# Patient Record
Sex: Male | Born: 1985
Health system: Southern US, Community
[De-identification: ages and names within clinical notes are randomized; demographics above are authoritative.]

## PROBLEM LIST (undated history)

## (undated) DIAGNOSIS — Z973 Presence of spectacles and contact lenses: Secondary | ICD-10-CM

## (undated) DIAGNOSIS — K219 Gastro-esophageal reflux disease without esophagitis: Secondary | ICD-10-CM

## (undated) DIAGNOSIS — R51 Headache: Secondary | ICD-10-CM

## (undated) DIAGNOSIS — Z87442 Personal history of urinary calculi: Secondary | ICD-10-CM

## (undated) DIAGNOSIS — R519 Headache, unspecified: Secondary | ICD-10-CM

## (undated) HISTORY — DX: Presence of spectacles and contact lenses: Z97.3

---

## 2011-08-09 ENCOUNTER — Ambulatory Visit: Payer: Self-pay

## 2011-08-09 DIAGNOSIS — M545 Low back pain, unspecified: Secondary | ICD-10-CM

## 2011-08-09 DIAGNOSIS — R300 Dysuria: Secondary | ICD-10-CM

## 2011-08-09 DIAGNOSIS — M542 Cervicalgia: Secondary | ICD-10-CM

## 2011-08-09 DIAGNOSIS — B9789 Other viral agents as the cause of diseases classified elsewhere: Secondary | ICD-10-CM

## 2013-07-25 ENCOUNTER — Ambulatory Visit (INDEPENDENT_AMBULATORY_CARE_PROVIDER_SITE_OTHER): Payer: 59 | Admitting: Family Medicine

## 2013-07-25 ENCOUNTER — Ambulatory Visit: Payer: 59

## 2013-07-25 VITALS — BP 122/74 | HR 86 | Temp 98.8°F | Resp 17 | Ht 64.5 in | Wt 162.2 lb

## 2013-07-25 DIAGNOSIS — R300 Dysuria: Secondary | ICD-10-CM

## 2013-07-25 DIAGNOSIS — R109 Unspecified abdominal pain: Secondary | ICD-10-CM

## 2013-07-25 DIAGNOSIS — R319 Hematuria, unspecified: Secondary | ICD-10-CM

## 2013-07-25 LAB — POCT CBC
Granulocyte percent: 56.4 %G (ref 37–80)
HCT, POC: 46.5 % (ref 43.5–53.7)
Hemoglobin: 15.1 g/dL (ref 14.1–18.1)
Lymph, poc: 2.3 (ref 0.6–3.4)
MCH, POC: 30.1 pg (ref 27–31.2)
MCHC: 32.5 g/dL (ref 31.8–35.4)
MCV: 92.6 fL (ref 80–97)
MID (cbc): 0.5 (ref 0–0.9)
MPV: 9.7 fL (ref 0–99.8)
POC Granulocyte: 3.7 (ref 2–6.9)
POC LYMPH PERCENT: 35.8 % (ref 10–50)
POC MID %: 7.8 %M (ref 0–12)
Platelet Count, POC: 275 10*3/uL (ref 142–424)
RBC: 5.02 M/uL (ref 4.69–6.13)
RDW, POC: 13.1 %
WBC: 6.5 10*3/uL (ref 4.6–10.2)

## 2013-07-25 LAB — POCT UA - MICROSCOPIC ONLY
Bacteria, U Microscopic: NEGATIVE
Casts, Ur, LPF, POC: NEGATIVE
RBC, urine, microscopic: NEGATIVE
WBC, Ur, HPF, POC: NEGATIVE

## 2013-07-25 LAB — POCT URINALYSIS DIPSTICK
Glucose, UA: NEGATIVE
Ketones, UA: NEGATIVE
Leukocytes, UA: NEGATIVE
Nitrite, UA: NEGATIVE
Protein, UA: NEGATIVE
Spec Grav, UA: 1.015
Urobilinogen, UA: 0.2

## 2013-07-25 LAB — COMPREHENSIVE METABOLIC PANEL
ALT: 24 U/L (ref 0–53)
AST: 19 U/L (ref 0–37)
BUN: 11 mg/dL (ref 6–23)
CO2: 29 mEq/L (ref 19–32)
Calcium: 9.8 mg/dL (ref 8.4–10.5)
Chloride: 100 mEq/L (ref 96–112)
Creat: 0.85 mg/dL (ref 0.50–1.35)
Glucose, Bld: 83 mg/dL (ref 70–99)
Potassium: 4 mEq/L (ref 3.5–5.3)
Sodium: 136 mEq/L (ref 135–145)
Total Bilirubin: 0.6 mg/dL (ref 0.3–1.2)

## 2013-07-25 LAB — COMPREHENSIVE METABOLIC PANEL WITH GFR
Albumin: 4.8 g/dL (ref 3.5–5.2)
Alkaline Phosphatase: 54 U/L (ref 39–117)
Total Protein: 7.4 g/dL (ref 6.0–8.3)

## 2013-07-25 IMAGING — CR DG ABDOMEN 1V
1 series · 1 of 1 positions shown · non-contrast
Comparison: None.

CLINICAL DATA: Left-sided flank pain.  Hematuria.

EXAM:
ABDOMEN - 1 VIEW

[AP]
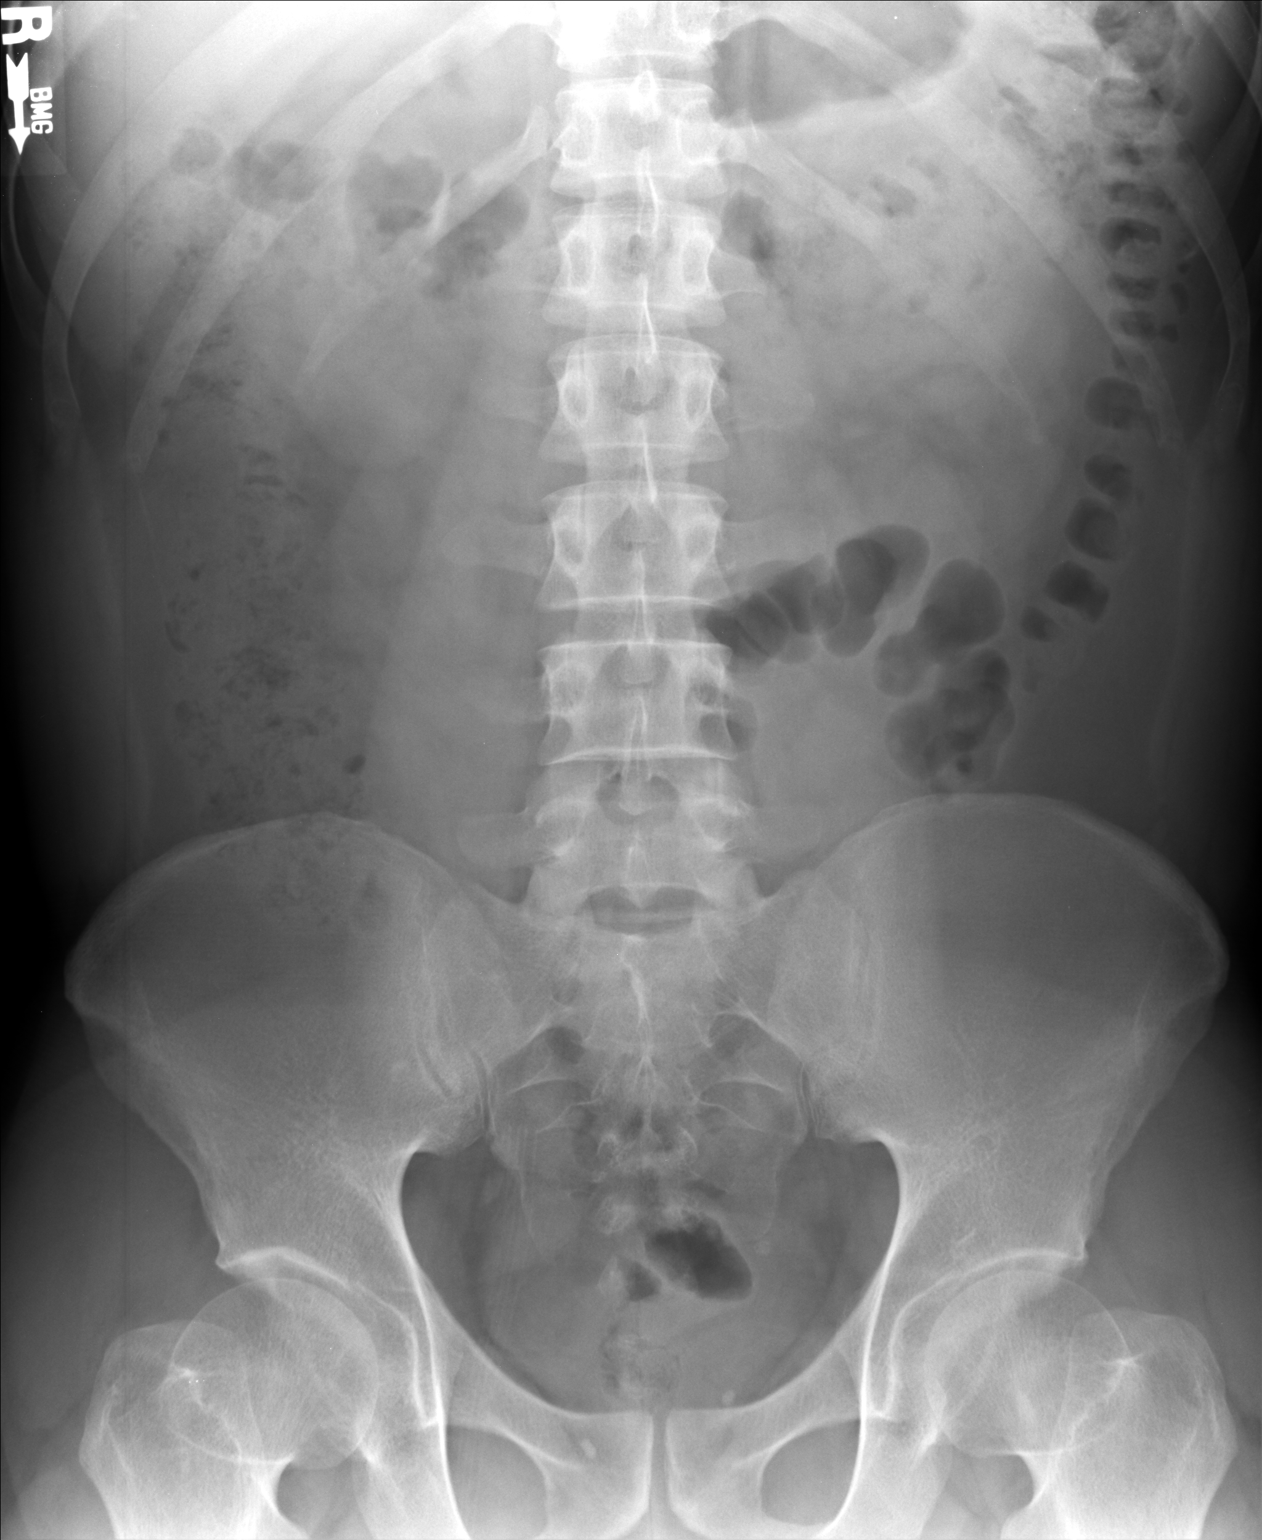

[1 of 1 positions shown; findings below may reference images not displayed]

FINDINGS: Nonspecific bowel gas pattern. No evidence of obstruction. No acute
osseous abnormality. No radiopaque density overlying the renal
shadows or expected position of the ureters. Multiple rounded
radiopaque densities are identified in the pelvis, the majority of
which definitely represent phleboliths. One the densities overlies
the bladder, and a bladder calculus cannot be completely excluded.
IMPRESSION: 1. No radiographic evidence of nephrolithiasis or ureteral calculus.

2. Multiple calcific densities in the pelvis, the majority of which
definitely represent phleboliths. One the densities overlies the
bladder, and a bladder calculus cannot be completely excluded.

## 2013-07-25 MED ORDER — TAMSULOSIN HCL 0.4 MG PO CAPS: 0.4000 mg | ORAL_CAPSULE | Freq: Every day | ORAL | Status: DC

## 2013-07-25 MED ORDER — KETOROLAC TROMETHAMINE 60 MG/2ML IM SOLN
60.0000 mg | Freq: Once | INTRAMUSCULAR | Status: AC
  Administered 2013-07-25: 60 mg via INTRAMUSCULAR

## 2013-07-25 NOTE — Patient Instructions (Signed)
Ureteral Colic Ureteral colic is spasm-like pain from the kidney or the ureter. This is often caused by a kidney stone. The pain is caused by the stone trying to get through the tubes that pass your pee. HOME CARE   Drink enough fluids to keep your pee (urine) clear or pale yellow.  Strain all your pee. A strainer will be provided. Keep anything caught in the strainer and bring it to your doctor. The stone causing the pain may be very small.  Only take medicine as told by your doctor.  Follow up with your doctor as told. GET HELP RIGHT AWAY IF:   Pain is not controlled with medicine.  Pain continues or gets worse.  The pain changes and there is chest or belly (abdominal) pain.  You pass out (faint).  You cannot pee.  You keep throwing up (vomiting).  You have a temperature by mouth above 102 F (38.9 C), not controlled by medicine. MAKE SURE YOU:   Understand these instructions.  Will watch this condition.  Will get help right away if you are not doing well or get worse. Document Released: 01/28/2008 Document Revised: 11/03/2011 Document Reviewed: 01/28/2008 [facility] Patient Information 2014 [place], [place]. Hematuria, Adult Hematuria (blood in your urine) can be caused by a bladder infection (cystitis), kidney infection (pyelonephritis), prostate infection (prostatitis), or kidney stone. Infections will usually respond to antibiotics (medications which kill germs), and a kidney stone will usually pass through your urine without further treatment. If you were put on antibiotics, take all the medicine until gone. You may feel better in a few days, but take all of your medicine or the infection may not respond and become more difficult to treat. If antibiotics were not given, an infection did not cause the blood in the urine. A further work up to find out the reason may be needed. HOME CARE INSTRUCTIONS   Drink lots of fluid, 3 to 4 quarts a day. If you have been diagnosed with  an infection, cranberry juice is especially recommended, in addition to large amounts of water.  Avoid caffeine, tea, and carbonated beverages, because they tend to irritate the bladder.  Avoid alcohol as it may irritate the prostate.  Only take over-the-counter or prescription medicines for pain, discomfort, or fever as directed by your caregiver.  If you have been diagnosed with a kidney stone follow your caregivers instructions regarding straining your urine to catch the stone. TO PREVENT FURTHER INFECTIONS:  Empty the bladder often. Avoid holding urine for long periods of time.  After a bowel movement, women should cleanse front to back. Use each tissue only once.  Empty the bladder before and after sexual intercourse if you are a male.  Return to your caregiver if you develop back pain, fever, nausea (feeling sick to your stomach), vomiting, or your symptoms (problems) are not better in 3 days. Return sooner if you are getting worse. If you have been requested to return for further testing make sure to keep your appointments. If an infection is not the cause of blood in your urine, X-rays may be required. Your caregiver will discuss this with you. SEEK IMMEDIATE MEDICAL CARE IF:   You have a persistent fever over 102 F (38.9 C).  You develop severe vomiting and are unable to keep the medication down.  You develop severe back or abdominal pain despite taking your medications.  You begin passing a large amount of blood or clots in your urine.  You feel extremely weak or  faint, or pass out. MAKE SURE YOU:   Understand these instructions.  Will watch your condition.  Will get help right away if you are not doing well or get worse. Document Released: 08/11/2005 Document Revised: 11/03/2011 Document Reviewed: 03/30/2008 [facility] Patient Information 2014 [place], [place].

## 2013-07-25 NOTE — Progress Notes (Signed)
Chief Complaint:  Chief Complaint  Patient presents with  . Dysuria  . Back Pain    HPI: Daniel Wang is a 27 y.o. male who is here with 3 day history of left sided flank pain and feels like it moves. He deneis fevers, chill. Worse with ROM especially bending forward, states he Has had hsitory of UTI several years ago 2011. During those 2 times, he  only had blood in his urine but not any bacteria. HE was given antibiotics and it cleared him of his symptoms. Denies fevers, chills, nausea, vomiting.Deneis any STDs. He also denies and rashes or penile discharge. He denies any scrotal or testicular pain. He has no burning with urination. He works at [employer] and drinks a lot of sweet ice tea.   History reviewed. No pertinent past medical history. History reviewed. No pertinent past surgical history. History   Social History  . Marital Status: Married    Spouse Name: N/A    Number of Children: N/A  . Years of Education: N/A   Social History Main Topics  . Smoking status: Never Smoker   . Smokeless tobacco: None  . Alcohol Use: No  . Drug Use: No  . Sexual Activity: No   Other Topics Concern  . None   Social History Narrative  . None   History reviewed. No pertinent family history. No Known Allergies Prior to Admission medications   Not on File     ROS: The patient denies fevers, chills, night sweats, unintentional weight loss, chest pain, palpitations, wheezing, dyspnea on exertion, nausea, vomiting, abdominal pain, dysuria, hematuria, melena, numbness, weakness, or tingling.   All other systems have been reviewed and were otherwise negative with the exception of those mentioned in the HPI and as above.    PHYSICAL EXAM: Filed Vitals:   07/25/13 1512  BP: 122/74  Pulse: 86  Temp: 98.8 F (37.1 C)  Resp: 17   Filed Vitals:   07/25/13 1512  Height: 5' 4.5" (1.638 m)  Weight: 162 lb 3.2 oz (73.573 kg)   Body mass index is 27.42 kg/(m^2).  General: Alert,  no acute distress HEENT:  Normocephalic, atraumatic, oropharynx patent. EOMI, PERRLA Cardiovascular:  Regular rate and rhythm, no rubs murmurs or gallops.  No Carotid bruits, radial pulse intact. No pedal edema.  Respiratory: Clear to auscultation bilaterally.  No wheezes, rales, or rhonchi.  No cyanosis, no use of accessory musculature GI: No organomegaly, abdomen is soft and non-tender, positive bowel sounds.  No masses. Skin: No rashes. Neurologic: Facial musculature symmetric. Psychiatric: Patient is appropriate throughout our interaction. Lymphatic: No cervical lymphadenopathy Musculoskeletal: Gait intact. Testicles nl Scrotum nl No inguinal hernias No CVA tenderness   LABS: Results for orders placed in visit on 07/25/13  POCT UA - MICROSCOPIC ONLY      Result Value Range   WBC, Ur, HPF, POC neg     RBC, urine, microscopic neg     Bacteria, U Microscopic neg     Mucus, UA neg     Epithelial cells, urine per micros neg     Crystals, Ur, HPF, POC neg     Casts, Ur, LPF, POC neg     Yeast, UA neg    POCT URINALYSIS DIPSTICK      Result Value Range   Color, UA yellow     Clarity, UA clear     Glucose, UA neg     Bilirubin, UA neg     Ketones, UA  neg     Spec Grav, UA 1.015     Blood, UA trace-lysed     pH, UA 7.0     Protein, UA neg     Urobilinogen, UA 0.2     Nitrite, UA neg     Leukocytes, UA Negative    POCT CBC      Result Value Range   WBC 6.5  4.6 - 10.2 K/uL   Lymph, poc 2.3  0.6 - 3.4   POC LYMPH PERCENT 35.8  10 - 50 %L   MID (cbc) 0.5  0 - 0.9   POC MID % 7.8  0 - 12 %M   POC Granulocyte 3.7  2 - 6.9   Granulocyte percent 56.4  37 - 80 %G   RBC 5.02  4.69 - 6.13 M/uL   Hemoglobin 15.1  14.1 - 18.1 g/dL   HCT, POC 16.1  09.6 - 53.7 %   MCV 92.6  80 - 97 fL   MCH, POC 30.1  27 - 31.2 pg   MCHC 32.5  31.8 - 35.4 g/dL   RDW, POC 04.5     Platelet Count, POC 275  142 - 424 K/uL   MPV 9.7  0 - 99.8 fL     EKG/XRAY:   Primary read interpreted by  Dr. Conley Rolls at [facility]. ? Renal stones vs phleboliths in bladder No obstruction, no free air    ASSESSMENT/PLAN: Encounter Diagnoses  Name Primary?  . Dysuria Yes  . Hematuria   . Flank pain    ? Renal stone vs cysitis Drinks a lot of  Sweet ice tea since works at [employer] He has had "UTI" in the past x 2 Will rx flomax ( short course) , push fluids, if no improvement in 48-72 hours then will rx cipro 500 mg BID x 1 week and refer to urology Toradal IM given CMP, GC pending F/u prn   Gross sideeffects, risk and benefits, and alternatives of medications d/w patient. Patient is aware that all medications have potential sideeffects and we are unable to predict every sideeffect or drug-drug interaction that may occur.  Hamilton Capri PHUONG, DO 07/25/2013 4:18 PM

## 2013-07-26 LAB — GC/CHLAMYDIA PROBE AMP
CT Probe RNA: NEGATIVE
GC Probe RNA: NEGATIVE

## 2013-07-27 ENCOUNTER — Telehealth: Payer: Self-pay | Admitting: Family Medicine

## 2013-07-27 DIAGNOSIS — R300 Dysuria: Secondary | ICD-10-CM

## 2013-07-27 MED ORDER — CIPROFLOXACIN HCL 500 MG PO TABS: 500.0000 mg | ORAL_TABLET | Freq: Two times a day (BID) | ORAL | Status: DC

## 2013-07-27 NOTE — Telephone Encounter (Signed)
No improvement, still has similar sxs. He is taking flomax. Told him about labs and xray results.

## 2013-08-03 ENCOUNTER — Ambulatory Visit (INDEPENDENT_AMBULATORY_CARE_PROVIDER_SITE_OTHER): Payer: 59 | Admitting: Family Medicine

## 2013-08-03 VITALS — BP 110/60 | HR 82 | Temp 98.3°F | Resp 16 | Ht 64.0 in | Wt 161.6 lb

## 2013-08-03 DIAGNOSIS — R319 Hematuria, unspecified: Secondary | ICD-10-CM

## 2013-08-03 DIAGNOSIS — R1032 Left lower quadrant pain: Secondary | ICD-10-CM

## 2013-08-03 LAB — POCT URINALYSIS DIPSTICK
Blood, UA: NEGATIVE
Glucose, UA: NEGATIVE
Leukocytes, UA: NEGATIVE
Nitrite, UA: NEGATIVE
Protein, UA: NEGATIVE
Urobilinogen, UA: 0.2

## 2013-08-03 LAB — POCT UA - MICROSCOPIC ONLY
Casts, Ur, LPF, POC: NEGATIVE
Crystals, Ur, HPF, POC: NEGATIVE
Mucus, UA: NEGATIVE

## 2013-08-03 MED ORDER — TAMSULOSIN HCL 0.4 MG PO CAPS: 0.4000 mg | ORAL_CAPSULE | Freq: Every day | ORAL | Status: DC

## 2013-08-03 NOTE — Progress Notes (Signed)
Subjective:    Patient ID: Daniel Wang, male    DOB: 01-06-1986, 27 y.o.   MRN: [id number] This chart was scribed for Norberto Sorenson, MD by Clydene Laming, ED Scribe. This patient was seen in room 1 and the patient's care was started at 5:47 PM. HPI HPI Comments: Daniel Wang is a 27 y.o. male who presents to the [facility] following up for a diagnosis of presumed left kidney stone by Dr. Conley Rolls last week. Pt had poss bladder calculus seen on KUB along with some trace hematuria when he presented w/ left flank pain. Uriprobe, urine clx, cbc, and cmp were normal.  Pt states he is overall much improved but he is still having some pain over the left lower flank which is radiating around to llq of abd.  Pt denies any pain in the bladder or in the penile or scrotal region. Pt also denies nausea, vomiting, rashes, or diarrhea. He had had a little bit of constipation.  He was prescribed flomax which he is still on and has completed the course of cipro. Four days ago pt experienced one episode of mild dysuria but did not see anything present in the urine.  Was not straining his urine at this void as he was at work but wonders if he passed something due to the penile pain during the void. Has not occurred before or since.  However, he is still having a little left flank and llq pain.  There are no active problems to display for this patient.  History reviewed. No pertinent past medical history. No past surgical history on file. No Known Allergies Prior to Admission medications   Medication Sig Start Date End Date Taking? Authorizing Provider  ciprofloxacin (CIPRO) 500 MG tablet Take 1 tablet (500 mg total) by mouth 2 (two) times daily. Return to office to recheck urine 48 hours after finished antibiotics. 07/27/13   Thao P Le, DO  tamsulosin (FLOMAX) 0.4 MG CAPS capsule Take 1 capsule (0.4 mg total) by mouth daily. 07/25/13   Thao Dayna Ramus, DO   History   Social History  . Marital Status: Married     Spouse Name: N/A    Number of Children: N/A  . Years of Education: N/A   Occupational History  . Not on file.   Social History Main Topics  . Smoking status: Never Smoker   . Smokeless tobacco: Not on file  . Alcohol Use: No  . Drug Use: No  . Sexual Activity: No   Other Topics Concern  . Not on file   Social History Narrative  . No narrative on file      Review of Systems  Constitutional: Negative for fever, chills, diaphoresis, activity change and appetite change.  Respiratory: Negative for shortness of breath.   Cardiovascular: Negative for chest pain.  Gastrointestinal: Positive for abdominal pain and constipation. Negative for nausea, vomiting, diarrhea, blood in stool, abdominal distention, anal bleeding and rectal pain.  Genitourinary: Positive for flank pain. Negative for dysuria, frequency, hematuria, decreased urine volume, discharge, difficulty urinating and penile pain.  Hematological: Negative for adenopathy.      BP 110/60  Pulse 82  Temp(Src) 98.3 F (36.8 C) (Oral)  Resp 16  Ht 5\' 4"  (1.626 m)  Wt 161 lb 9.6 oz (73.301 kg)  BMI 27.72 kg/m2  SpO2 99% Objective:   Physical Exam  Constitutional: He appears well-developed and well-nourished. No distress.  HENT:  Head: Normocephalic and atraumatic.  Neck: Normal  range of motion. Neck supple. No thyromegaly present.  Cardiovascular: Normal rate, regular rhythm and normal heart sounds.   Pulmonary/Chest: Effort normal and breath sounds normal.  Abdominal: Soft. Normal appearance and bowel sounds are normal. He exhibits no distension and no mass. There is no hepatosplenomegaly. There is tenderness ( right and left lower quadrant (mild)) in the left lower quadrant. There is no rigidity, no rebound, no guarding and no CVA tenderness. No hernia.  Lymphadenopathy:    He has no cervical adenopathy.  Skin: He is not diaphoretic.      Results for orders placed in visit on 08/03/13  POCT UA - MICROSCOPIC  ONLY      Result Value Range   WBC, Ur, HPF, POC 0-2     RBC, urine, microscopic 0-1     Bacteria, U Microscopic trace     Mucus, UA neg     Epithelial cells, urine per micros 0-2     Crystals, Ur, HPF, POC neg     Casts, Ur, LPF, POC neg     Yeast, UA neg    POCT URINALYSIS DIPSTICK      Result Value Range   Color, UA light yellow     Clarity, UA clear     Glucose, UA neg     Bilirubin, UA neg     Ketones, UA neg     Spec Grav, UA 1.010     Blood, UA neg     pH, UA 7.0     Protein, UA neg     Urobilinogen, UA 0.2     Nitrite, UA neg     Leukocytes, UA Negative     Assessment & Plan:   LLQ abdominal pain - Plan: POCT UA - Microscopic Only, POCT urinalysis dipstick. Sounds like pt has passed stone, but rec cont flomax for now as long as not having any side effects since still having pain. Perhaps pain could be due to constipation so augment w/ miralax or colace if needed.  Rec watchful waiting for the next 1-2 wks. If pain continues, rec repeat eval in clinic w/ repeat labs and may need to consider CT scan.  Hematuria - Plan: POCT UA - Microscopic Only, POCT urinalysis dipstick - resolved.  Meds ordered this encounter  Medications  . tamsulosin (FLOMAX) 0.4 MG CAPS capsule    Sig: Take 1 capsule (0.4 mg total) by mouth daily.    Dispense:  30 capsule    Refill:  1    I personally performed the services described in this documentation, which was scribed in my presence. The recorded information has been reviewed and considered, and addended by me as needed.  Norberto Sorenson, MD MPH

## 2013-08-03 NOTE — Patient Instructions (Signed)
If your pain continues for another 2 weeks or gets WORSE or you notice any other changes, please come back and we will consider repeating your blood labs, rectal exam, repeating your xray and discuss whether you need a CT scan.  However, with the urine looking great today, I hope that if you continue on your flomax and continue drinking LOTS of water that your pain will resolve.  Make sure you are not having ANY constipation or gas pain - consider adding in a fiber supplement.  Ureteral Colic (Kidney Stones) Ureteral colic is the result of a condition when kidney stones form inside the kidney. Once kidney stones are formed they may move into the tube that connects the kidney with the bladder (ureter). If this occurs, this condition may cause pain (colic) in the ureter.  CAUSES  Pain is caused by stone movement in the ureter and the obstruction caused by the stone. SYMPTOMS  The pain comes and goes as the ureter contracts around the stone. The pain is usually intense, sharp, and stabbing in character. The location of the pain may move as the stone moves through the ureter. When the stone is near the kidney the pain is usually located in the back and radiates to the belly (abdomen). When the stone is ready to pass into the bladder the pain is often located in the lower abdomen on the side the stone is located. At this location, the symptoms may mimic those of a urinary tract infection with urinary frequency. Once the stone is located here it often passes into the bladder and the pain disappears completely. TREATMENT   Your caregiver will provide you with medicine for pain relief.  You may require specialized follow-up X-rays.  The absence of pain does not always mean that the stone has passed. It may have just stopped moving. If the urine remains completely obstructed, it can cause loss of kidney function or even complete destruction of the involved kidney. It is your responsibility and in your interest  that X-rays and follow-ups as suggested by your caregiver are completed. Relief of pain without passage of the stone can be associated with severe damage to the kidney, including loss of kidney function on that side.  If your stone does not pass on its own, additional measures may be taken by your caregiver to ensure its removal. HOME CARE INSTRUCTIONS   Increase your fluid intake. Water is the preferred fluid since juices containing vitamin C may acidify the urine making it less likely for certain stones (uric acid stones) to pass.  Strain all urine. A strainer will be provided. Keep all particulate matter or stones for your caregiver to inspect.  Take your pain medicine as directed.  Make a follow-up appointment with your caregiver as directed.  Remember that the goal is passage of your stone. The absence of pain does not mean the stone is gone. Follow your caregiver's instructions.  Only take over-the-counter or prescription medicines for pain, discomfort, or fever as directed by your caregiver. SEEK MEDICAL CARE IF:   Pain cannot be controlled with the prescribed medicine.  You have a fever.  Pain continues for longer than your caregiver advises it should.  There is a change in the pain, and you develop chest discomfort or constant abdominal pain.  You feel faint or pass out. MAKE SURE YOU:   Understand these instructions.  Will watch your condition.  Will get help right away if you are not doing well or get worse.  Document Released: 05/21/2005 Document Revised: 12/06/2012 Document Reviewed: 02/05/2011 [facility] Patient Information 2014 [place], [place].

## 2014-01-25 ENCOUNTER — Ambulatory Visit (INDEPENDENT_AMBULATORY_CARE_PROVIDER_SITE_OTHER): Payer: 59 | Admitting: Podiatry

## 2014-01-25 ENCOUNTER — Ambulatory Visit: Payer: 59 | Admitting: Podiatry

## 2014-01-25 ENCOUNTER — Encounter: Payer: Self-pay | Admitting: Podiatry

## 2014-01-25 VITALS — BP 143/99 | HR 76 | Resp 12

## 2014-01-25 DIAGNOSIS — L600 Ingrowing nail: Secondary | ICD-10-CM

## 2014-01-25 NOTE — Progress Notes (Signed)
Subjective:     Patient ID: Daniel Wang, male   DOB: 1986-06-17, 28 y.o.   MRN: [id number]  HPI patient points to left big toe stating that the corner has been sore and that he is tried to trim it and soak it without relief of symptoms for the last few months   Review of Systems  All other systems reviewed and are negative.      Objective:   Physical Exam  Nursing note and vitals reviewed. Constitutional: He is oriented to person, place, and time.  Cardiovascular: Intact distal pulses.   Musculoskeletal: Normal range of motion.  Neurological: He is oriented to person, place, and time.  Skin: Skin is warm.   neurovascular status intact with muscle strength adequate range of motion within normal limits. Patient's digits are well perfused and he is found to have incurvated left hallux medial border it's painful when pressed with no current signs of drainage     Assessment:     Ingrown toenail deformity with pain left hallux medial border    Plan:     H&P performed condition discussed. Recommended correction and explained possibilities for problems with his. Patient wants procedure and today I infiltrated 60 mg Xylocaine Marcaine mixture remove the medial border exposed matrix and applied chemical phenol 3 applications 30 seconds followed by alcohol lavaged and sterile dressing

## 2014-01-25 NOTE — Patient Instructions (Signed)

## 2014-01-25 NOTE — Progress Notes (Signed)
   Subjective:    Patient ID: Daniel Wang, male    DOB: 1986-04-23, 28 y.o.   MRN: [id number]  HPI PT STATED LT FOOT GREAT TOENAIL IS A LITTLE SORE. THE TOENAIL IS MUCH BETTER BUT SOMETIMES IT GET WORSE WHEN PUTTING PRESSURE ON IT. TRIED TRIM, PUT ALCOHOL, AND PEROXIDE AND HAVE TEMPORARY RELIEF.    Review of Systems  All other systems reviewed and are negative.      Objective:   Physical Exam        Assessment & Plan:

## 2014-05-29 ENCOUNTER — Ambulatory Visit (INDEPENDENT_AMBULATORY_CARE_PROVIDER_SITE_OTHER): Payer: Self-pay | Admitting: Podiatry

## 2014-05-29 ENCOUNTER — Encounter: Payer: Self-pay | Admitting: Podiatry

## 2014-05-29 VITALS — BP 115/68 | HR 74 | Resp 17

## 2014-05-29 DIAGNOSIS — L600 Ingrowing nail: Secondary | ICD-10-CM

## 2014-05-29 NOTE — Progress Notes (Signed)
   Subjective:    Patient ID: Daniel SladeJuan Wang, male    DOB: May 03, 1986, 28 y.o.   MRN: [id number]  HPI Painful left great ingrown nail,    Review of Systems     Objective:   Physical Exam        Assessment & Plan:

## 2014-05-29 NOTE — Patient Instructions (Signed)

## 2014-05-30 NOTE — Progress Notes (Signed)
Subjective:     Patient ID: Daniel Wang, male   DOB: 1986/04/28, 28 y.o.   MRN: [id number]  HPI patient points to left big toe stating his ingrown has really started to bother me again for about 6 months ago   Review of Systems     Objective:   Physical Exam Neurovascular status intact with incurvated left hallux medial border that's painful when pressed    Assessment:     Probable reoccurrence of ingrown toenail left hallux    Plan:     Anesthetized 60 mg Xylocaine Marcaine mixture and under sterile conditions remove the medial border and found a plug of nail tissue which I cleaned out completely and applied phenol 3 applications 30 seconds followed by alcohol lavaged and sterile dressing

## 2015-11-16 ENCOUNTER — Ambulatory Visit (INDEPENDENT_AMBULATORY_CARE_PROVIDER_SITE_OTHER): Payer: Self-pay | Admitting: Physician Assistant

## 2015-11-16 VITALS — BP 118/74 | HR 72 | Temp 99.3°F | Resp 14 | Ht 64.5 in | Wt 156.0 lb

## 2015-11-16 DIAGNOSIS — J0110 Acute frontal sinusitis, unspecified: Secondary | ICD-10-CM

## 2015-11-16 MED ORDER — GUAIFENESIN ER 1200 MG PO TB12: 1.0000 | ORAL_TABLET | Freq: Two times a day (BID) | ORAL | Status: AC

## 2015-11-16 MED ORDER — AMOXICILLIN-POT CLAVULANATE 875-125 MG PO TABS: 1.0000 | ORAL_TABLET | Freq: Two times a day (BID) | ORAL | Status: DC

## 2015-11-16 MED ORDER — FLUTICASONE PROPIONATE 50 MCG/ACT NA SUSP: 2.0000 | Freq: Every day | NASAL | Status: DC

## 2015-11-16 NOTE — Progress Notes (Signed)
   Daniel SladeJuan Wang  MRN: [id number] DOB: [birth year]  Subjective:  Pt presents to clinic with 3 week h/o frontal sinus pressure.  He really does not have headaches but just a pressure feeling that worsens when he bends over and he has developed some dizziness.  He has no h/o sinus problems and does not have known seasonal allergies.  He did have a wisdom tooth pulled in Oct 2016 and the sinus was affected and the oral surgeon told him to stop blowing his nose as much to decrease the pressure in her left maxillary sinus.  Home treatment - cold preps with sudafed  There are no active problems to display for this patient.   No current outpatient prescriptions on file prior to visit.   No current facility-administered medications on file prior to visit.    No Known Allergies  Review of Systems  HENT: Positive for congestion and rhinorrhea (green).   Respiratory: Negative for cough.    Objective:  BP 118/74 mmHg  Pulse 72  Temp(Src) 99.3 F (37.4 C) (Oral)  Resp 14  Ht 5' 4.5" (1.638 m)  Wt 156 lb (70.761 kg)  BMI 26.37 kg/m2  SpO2 97%  Physical Exam  Constitutional: He is oriented to person, place, and time and well-developed, well-nourished, and in no distress.  HENT:  Head: Normocephalic and atraumatic.  Right Ear: Hearing, tympanic membrane, external ear and ear canal normal.  Left Ear: Hearing, tympanic membrane, external ear and ear canal normal.  Nose: Right sinus exhibits maxillary sinus tenderness (mild) and frontal sinus tenderness (the worst). Left sinus exhibits frontal sinus tenderness.  Mouth/Throat: Uvula is midline, oropharynx is clear and moist and mucous membranes are normal.  Eyes: Conjunctivae are normal.  Neck: Normal range of motion.  Cardiovascular: Normal rate, regular rhythm and normal heart sounds.   Pulmonary/Chest: Effort normal and breath sounds normal. He has no wheezes.  Lymphadenopathy:       Head (right side): No tonsillar adenopathy present.       Head (left side): No tonsillar adenopathy present.    He has no cervical adenopathy.       Right: No supraclavicular adenopathy present.       Left: No supraclavicular adenopathy present.  Neurological: He is alert and oriented to person, place, and time. Gait normal.  Skin: Skin is warm and dry.  Psychiatric: Mood, memory, affect and judgment normal.    Assessment and Plan :  Acute frontal sinusitis, recurrence not specified - Plan: amoxicillin-clavulanate (AUGMENTIN) 875-125 MG tablet, Guaifenesin (MUCINEX MAXIMUM STRENGTH) 1200 MG TB12, fluticasone (FLONASE) 50 MCG/ACT nasal spray   Stop OTC medications that he has been using - push fluids and use the above treatment  Benny LennertSarah Amogh Komatsu PA-C  [facility] 11/16/2015 5:35 PM

## 2015-11-16 NOTE — Patient Instructions (Addendum)
     IF you received an x-ray today, you will receive an invoice from [facility]. Please contact [facility] at [phone] with questions or concerns regarding your invoice.   IF you received labwork today, you will receive an invoice from [facility]. Please contact [facility] at [phone] with questions or concerns regarding your invoice.   Our billing staff will not be able to assist you with questions regarding bills from these companies.  You will be contacted with the lab results as soon as they are available. The fastest way to get your results is to activate your My Chart account. Instructions are located on the last page of this paperwork. If you have not heard from us regarding the results in 2 weeks, please contact this office.      

## 2016-04-07 ENCOUNTER — Encounter (HOSPITAL_COMMUNITY): Payer: Self-pay | Admitting: *Deleted

## 2016-04-07 ENCOUNTER — Emergency Department (HOSPITAL_COMMUNITY): Payer: Self-pay

## 2016-04-07 ENCOUNTER — Emergency Department (HOSPITAL_COMMUNITY)
Admission: EM | Admit: 2016-04-07 | Discharge: 2016-04-08 | Disposition: A | Discharge status: Home / Self Care | Payer: Self-pay | Attending: Emergency Medicine | Admitting: Emergency Medicine

## 2016-04-07 DIAGNOSIS — R079 Chest pain, unspecified: Secondary | ICD-10-CM | POA: Insufficient documentation

## 2016-04-07 LAB — BASIC METABOLIC PANEL
Anion gap: 7 (ref 5–15)
BUN: 15 mg/dL (ref 6–20)
CALCIUM: 10 mg/dL (ref 8.9–10.3)
CO2: 28 mmol/L (ref 22–32)
CREATININE: 0.93 mg/dL (ref 0.61–1.24)
Chloride: 103 mmol/L (ref 101–111)
Glucose, Bld: 103 mg/dL — ABNORMAL HIGH (ref 65–99)
Potassium: 3.7 mmol/L (ref 3.5–5.1)
SODIUM: 138 mmol/L (ref 135–145)

## 2016-04-07 LAB — I-STAT TROPONIN, ED: TROPONIN I, POC: 0 ng/mL (ref 0.00–0.08)

## 2016-04-07 LAB — CBC
HCT: 45.2 % (ref 39.0–52.0)
Hemoglobin: 15.1 g/dL (ref 13.0–17.0)
MCH: 29.3 pg (ref 26.0–34.0)
MCHC: 33.4 g/dL (ref 30.0–36.0)
MCV: 87.8 fL (ref 78.0–100.0)
PLATELETS: 296 10*3/uL (ref 150–400)
RBC: 5.15 MIL/uL (ref 4.22–5.81)
RDW: 12.7 % (ref 11.5–15.5)
WBC: 9.7 10*3/uL (ref 4.0–10.5)

## 2016-04-07 IMAGING — DX DG CHEST 2V
2 series · 2 of 2 positions shown · non-contrast
Comparison: None.

CLINICAL DATA: Acute onset of sharp right-sided chest pain and mid
chest pressure. Congestion. Initial encounter.

EXAM:
CHEST  2 VIEW

[chest pa]
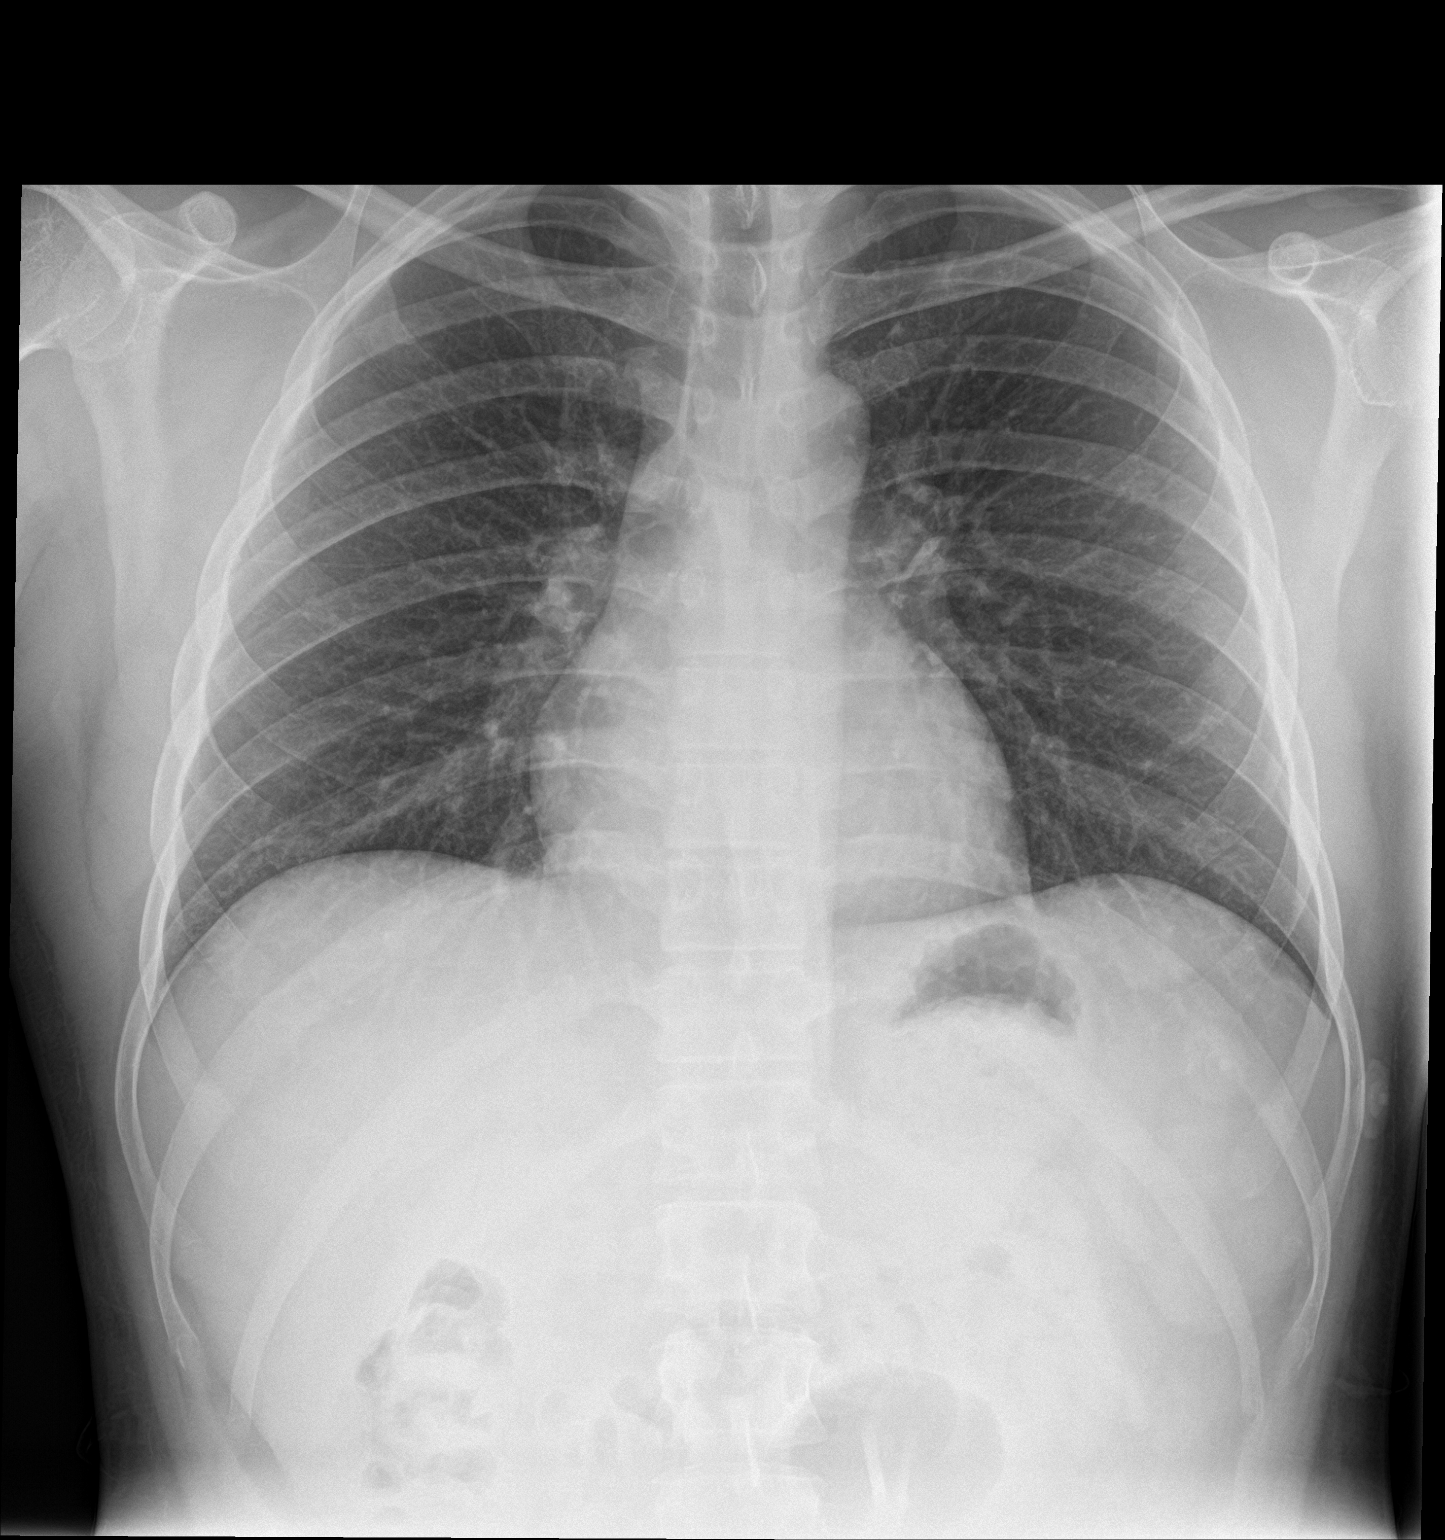

[chest lat]
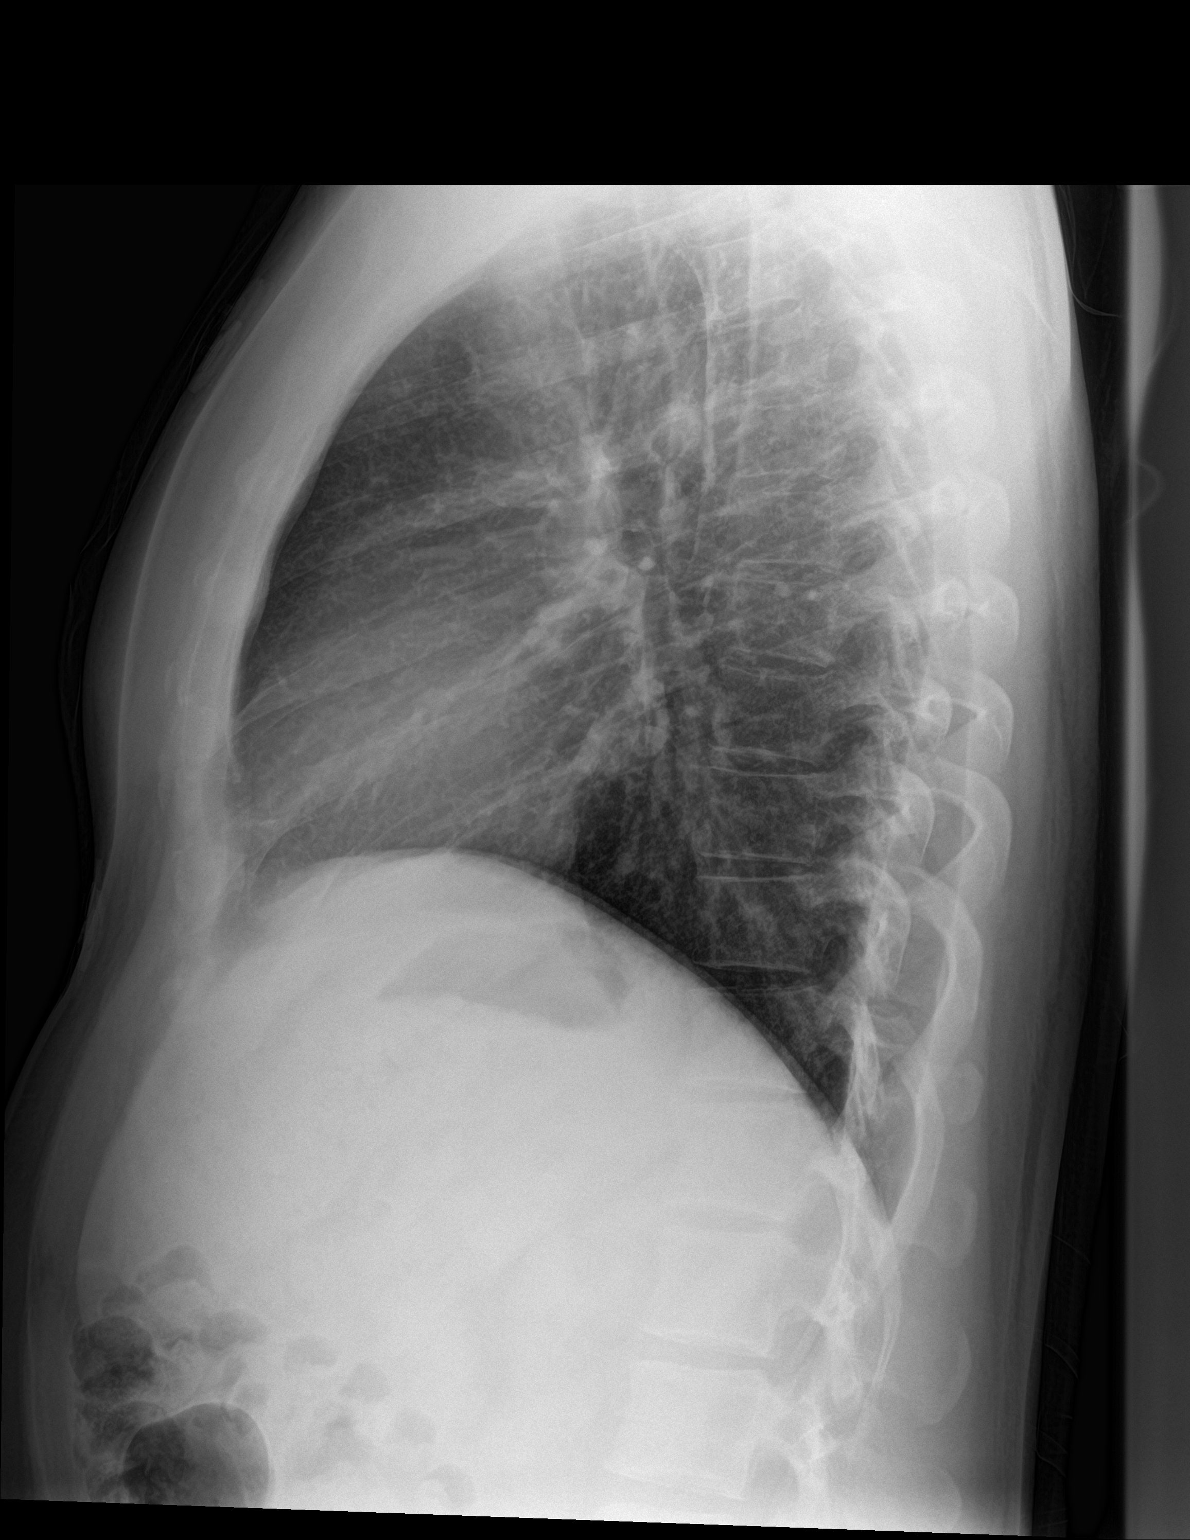

[2 of 2 positions shown; findings below may reference images not displayed]

FINDINGS: The lungs are well-aerated and clear. There is no evidence of focal
opacification, pleural effusion or pneumothorax.

The heart is normal in size; the mediastinal contour is within
normal limits. No acute osseous abnormalities are seen.
IMPRESSION: No acute cardiopulmonary process seen.

## 2016-04-07 MED ORDER — FAMOTIDINE 20 MG PO TABS: 20.0000 mg | ORAL_TABLET | Freq: Two times a day (BID) | ORAL | 0 refills | Status: DC

## 2016-04-07 NOTE — Discharge Instructions (Signed)
Please read and follow all provided instructions.  Your diagnoses today include:  1. Nonspecific chest pain    Tests performed today include:  An EKG of your heart  A chest x-ray  Cardiac enzymes - a blood test for heart muscle damage  Blood counts and electrolytes  Vital signs. See below for your results today.   Medications prescribed:   Pepcid (famotidine) - antihistamine  You can find this medication over-the-counter.   DO NOT exceed:   20mg  Pepcid every 12 hours  Take any prescribed medications only as directed.  Follow-up instructions: Please follow-up with your primary care provider as needed if symptoms persist.   Return instructions:  SEEK IMMEDIATE MEDICAL ATTENTION IF:  You have severe chest pain, especially if the pain is crushing or pressure-like and spreads to the arms, back, neck, or jaw, or if you have sweating, nausea (feeling sick to your stomach), or shortness of breath. THIS IS AN EMERGENCY. Don't wait to see if the pain will go away. Get medical help at once. Call 911 or 0 (operator). DO NOT drive yourself to the hospital.   Your chest pain gets worse and does not go away with rest.   You have an attack of chest pain lasting longer than usual, despite rest and treatment with the medications your caregiver has prescribed.   You wake from sleep with chest pain or shortness of breath.  You feel dizzy or faint.  You have chest pain not typical of your usual pain for which you originally saw your caregiver.   You have any other emergent concerns regarding your health.  Additional Information: Chest pain comes from many different causes. Your caregiver has diagnosed you as having chest pain that is not specific for one problem, but does not require admission.  You are at low risk for an acute heart condition or other serious illness.   Your vital signs today were: BP 132/98    Pulse 69    Temp 98.3 F (36.8 C) (Oral)    Resp 13    Ht 5\' 2"  (1.575  m)    Wt 72.6 kg    SpO2 99%    BMI 29.26 kg/m  If your blood pressure (BP) was elevated above 135/85 this visit, please have this repeated by your doctor within one month. --------------

## 2016-04-07 NOTE — ED Provider Notes (Signed)
[facility]-EMER[facility]ENCY DEPT Provider Note   CSN: [id number] Arrival date & time: 04/07/16  1928     History   Chief Complaint Chief Complaint  Patient presents with  . Chest Pain    HPI Daniel Wang is a 30 y.o. male.  Patient presents with four-day history of chest pain. Pain started 4 days ago on the right side of the chest. It went away and later that they came up on the left side of the chest. The next day moved to the middle of the chest. Pain comes and goes. It does not radiate. It is not exertional. It is not associated with shortness of breath, diaphoresis, nausea or vomiting. Symptoms have been minor today. Last episode of more substantial pain was last night. Patient does work in [employer] and does a lot of repetitive lifting. Patient also has chronic headaches for which he takes NSAIDs and Tylenol on a daily basis. No alcohol. No hypertension, high cholesterol, diabetes, smoking history, family history of coronary artery disease in first degree relatives.       History reviewed. No pertinent past medical history.  There are no active problems to display for this patient.   History reviewed. No pertinent surgical history.     Home Medications    Prior to Admission medications   Medication Sig Start Date End Date Taking? Authorizing Provider  amoxicillin-clavulanate (AUGMENTIN) 875-125 MG tablet Take 1 tablet by mouth 2 (two) times daily. 11/16/15   Morrell RiddleSarah L Weber, PA-C  famotidine (PEPCID) 20 MG tablet Take 1 tablet (20 mg total) by mouth 2 (two) times daily. 04/07/16   Renne CriglerJoshua Ladye Macnaughton, PA-C  fluticasone (FLONASE) 50 MCG/ACT nasal spray Place 2 sprays into both nostrils daily. 11/16/15   Morrell RiddleSarah L Weber, PA-C    Family History History reviewed. No pertinent family history.  Social History Social History  Substance Use Topics  . Smoking status: Never Smoker  . Smokeless tobacco: Never Used  . Alcohol use Yes     Allergies   Review of patient's allergies indicates  no known allergies.   Review of Systems Review of Systems  Constitutional: Negative for diaphoresis and fever.  Eyes: Negative for redness.  Respiratory: Negative for cough and shortness of breath.   Cardiovascular: Positive for chest pain. Negative for palpitations and leg swelling.  Gastrointestinal: Negative for abdominal pain, nausea and vomiting.  Genitourinary: Negative for dysuria.  Musculoskeletal: Negative for back pain and neck pain.  Skin: Negative for rash.  Neurological: Negative for syncope and light-headedness.  Psychiatric/Behavioral: The patient is not nervous/anxious.      Physical Exam Updated Vital Signs BP 132/98   Pulse 69   Temp 98.3 F (36.8 C) (Oral)   Resp 13   Ht 5\' 2"  (1.575 m)   Wt 72.6 kg   SpO2 99%   BMI 29.26 kg/m   Physical Exam  Constitutional: He appears well-developed and well-nourished.  HENT:  Head: Normocephalic and atraumatic.  Mouth/Throat: Mucous membranes are normal. Mucous membranes are not dry.  Eyes: Conjunctivae are normal.  Neck: Trachea normal and normal range of motion. Neck supple. Normal carotid pulses and no JVD present. No muscular tenderness present. Carotid bruit is not present. No tracheal deviation present.  Cardiovascular: Normal rate, regular rhythm, S1 normal, S2 normal, normal heart sounds and intact distal pulses.  Exam reveals no distant heart sounds and no decreased pulses.   No murmur heard. Pulmonary/Chest: Effort normal and breath sounds normal. No respiratory distress. He has no wheezes. He  exhibits tenderness (L sternal border).  Abdominal: Soft. Normal aorta and bowel sounds are normal. There is no tenderness. There is no rebound and no guarding.  Musculoskeletal: He exhibits no edema.  Neurological: He is alert.  Skin: Skin is warm and dry. He is not diaphoretic. No cyanosis. No pallor.  Psychiatric: He has a normal mood and affect.  Nursing note and vitals reviewed.    ED Treatments / Results    Labs (all labs ordered are listed, but only abnormal results are displayed) Labs Reviewed  BASIC METABOLIC PANEL - Abnormal; Notable for the following:       Result Value   Glucose, Bld 103 (*)    All other components within normal limits  CBC  I-STAT TROPOININ, ED    EKG  EKG Interpretation  Date/Time:  Monday April 07 2016 19:31:54 EDT Ventricular Rate:  95 PR Interval:  142 QRS Duration: 88 QT Interval:  346 QTC Calculation: 434 R Axis:   71 Text Interpretation:  Normal sinus rhythm with sinus arrhythmia Normal ECG No old tracing to compare Confirmed by [facility]  MD, DAVID ([id number]) on 04/07/2016 10:50:40 PM       Radiology Dg Chest 2 View  Result Date: 04/07/2016 CLINICAL DATA:  Acute onset of sharp right-sided chest pain and mid chest pressure. Congestion. Initial encounter. EXAM: CHEST  2 VIEW COMPARISON:  None. FINDINGS: The lungs are well-aerated and clear. There is no evidence of focal opacification, pleural effusion or pneumothorax. The heart is normal in size; the mediastinal contour is within normal limits. No acute osseous abnormalities are seen. IMPRESSION: No acute cardiopulmonary process seen. Electronically Signed   By: Roanna Raider M.D.   On: 04/07/2016 20:31    Procedures Procedures (including critical care time)  Medications Ordered in ED Medications - No data to display   Initial Impression / Assessment and Plan / ED Course  I have reviewed the triage vital signs and the nursing notes.  Pertinent labs & imaging results that were available during my care of the patient were reviewed by me and considered in my medical decision making (see chart for details).  Clinical Course  Comment By Time  Patient seen and examined. We reviewed all of his results. Low risk per heart score. Recommended avoidance of NSAIDs, rest as possible, Pepcid.  Patient was counseled to return with severe chest pain, especially if the pain is crushing or pressure-like and  spreads to the arms, back, neck, or jaw, or if they have sweating, nausea, or shortness of breath with the pain. They were encouraged to call 911 with these symptoms.   They were also told to return if their chest pain gets worse and does not go away with rest, they have an attack of chest pain lasting longer than usual despite rest and treatment with the medications their caregiver has prescribed, if they wake from sleep with chest pain or shortness of breath, if they feel dizzy or faint, if they have chest pain not typical of their usual pain, or if they have any other emergent concerns regarding their health.  The patient verbalized understanding and agreed.  Renne Crigler, PA-C 08/14 2338     Final Clinical Impressions(s) / ED Diagnoses   Final diagnoses:  Nonspecific chest pain   Patient with chest tightness, Reproducible component. Nonexertional, no radiation, no vomiting, no diaphoresis. HEART=0. No PE risk factors. Feel patient is low risk for ACS given history (poor story for ACS/MI), negative troponin(s), normal/unchanged EKG. Symptomatic treatment.  New Prescriptions New Prescriptions   FAMOTIDINE (PEPCID) 20 MG TABLET    Take 1 tablet (20 mg total) by mouth 2 (two) times daily.     Renne CriglerJoshua Erron Wengert, PA-C 04/07/16 2340    Dione Boozeavid Glick, MD 04/08/16 [phone]

## 2016-04-07 NOTE — ED Triage Notes (Signed)
Pt states a couple days ago he had right side chest pain, pain has not moved to the center. Pt denies shortness of breath, n/v, diaphoresis.

## 2016-04-07 NOTE — ED Notes (Signed)
Renne CriglerJoshua Geiple, PA at bedside at this time.

## 2017-03-25 ENCOUNTER — Encounter (HOSPITAL_COMMUNITY): Payer: Self-pay | Admitting: Emergency Medicine

## 2017-03-25 ENCOUNTER — Emergency Department (HOSPITAL_COMMUNITY): Payer: Self-pay

## 2017-03-25 ENCOUNTER — Emergency Department (HOSPITAL_COMMUNITY)
Admission: EM | Admit: 2017-03-25 | Discharge: 2017-03-25 | Disposition: A | Discharge status: Home / Self Care | Payer: Self-pay | Attending: Emergency Medicine | Admitting: Emergency Medicine

## 2017-03-25 DIAGNOSIS — N201 Calculus of ureter: Secondary | ICD-10-CM | POA: Insufficient documentation

## 2017-03-25 LAB — BASIC METABOLIC PANEL
ANION GAP: 12 (ref 5–15)
BUN: 12 mg/dL (ref 6–20)
CALCIUM: 10.1 mg/dL (ref 8.9–10.3)
CO2: 25 mmol/L (ref 22–32)
Chloride: 103 mmol/L (ref 101–111)
Creatinine, Ser: 1 mg/dL (ref 0.61–1.24)
GFR calc Af Amer: >60 mL/min (ref >60)
GLUCOSE: 125 mg/dL — AB (ref 65–99)
Potassium: 3.8 mmol/L (ref 3.5–5.1)
SODIUM: 140 mmol/L (ref 135–145)

## 2017-03-25 LAB — CBC
HCT: 44.4 % (ref 39.0–52.0)
HEMOGLOBIN: 15.5 g/dL (ref 13.0–17.0)
MCH: 29.7 pg (ref 26.0–34.0)
MCHC: 34.9 g/dL (ref 30.0–36.0)
MCV: 85.1 fL (ref 78.0–100.0)
PLATELETS: 300 10*3/uL (ref 150–400)
RBC: 5.22 MIL/uL (ref 4.22–5.81)
RDW: 12.7 % (ref 11.5–15.5)
WBC: 9.6 10*3/uL (ref 4.0–10.5)

## 2017-03-25 IMAGING — CT CT RENAL STONE PROTOCOL
2 of 4 series · 16 of 46 positions shown, 18 images · non-contrast
Comparison: None.

CLINICAL DATA: 30-year-old male with a history of right-sided flank
pain

EXAM:
CT ABDOMEN AND PELVIS WITHOUT CONTRAST
TECHNIQUE: Multidetector CT imaging of the abdomen and pelvis was performed
following the standard protocol without IV contrast.

[Series 3: ap without · axial · non-contrast · 0.70mm/px · z∈[+814,+1249]mm · 13 of 97 slices shown, 15 images]
[im 5/97  soft-tissue]
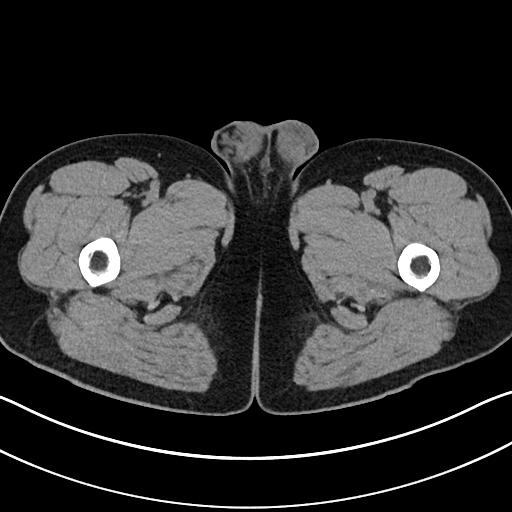
[im 5/97  bone]
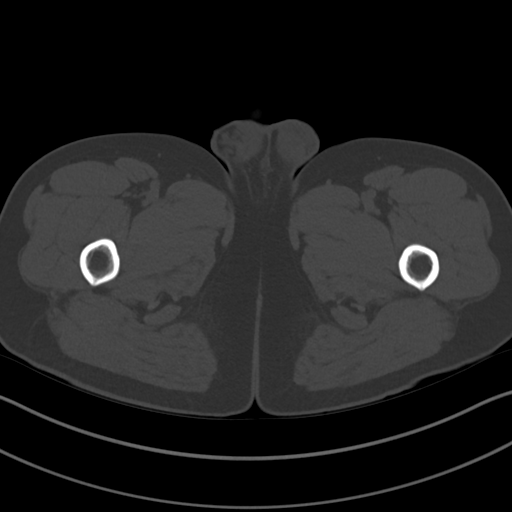
[im 14/97  soft-tissue]
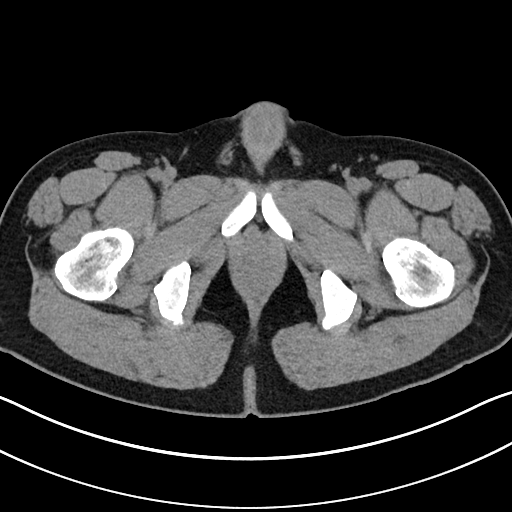
[im 19/97  soft-tissue]
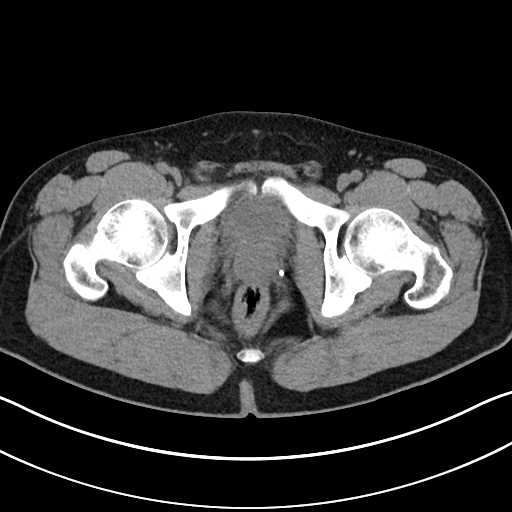
[im 28/97  soft-tissue]
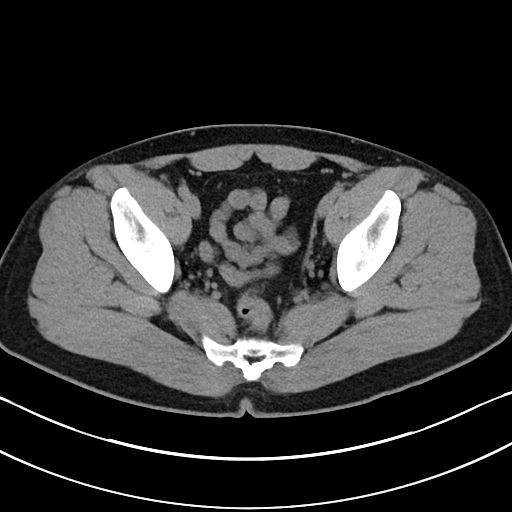
[im 33/97  soft-tissue]
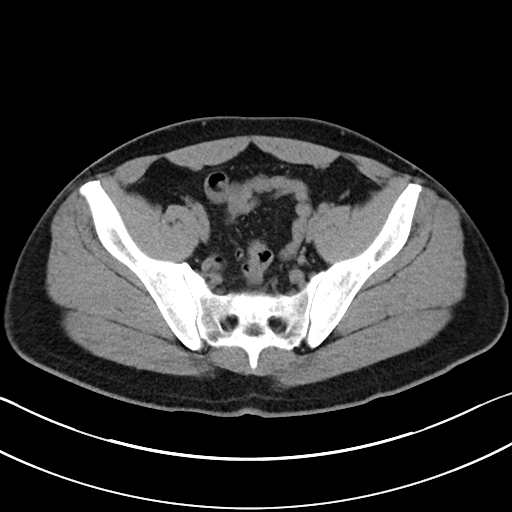
[im 42/97  soft-tissue]
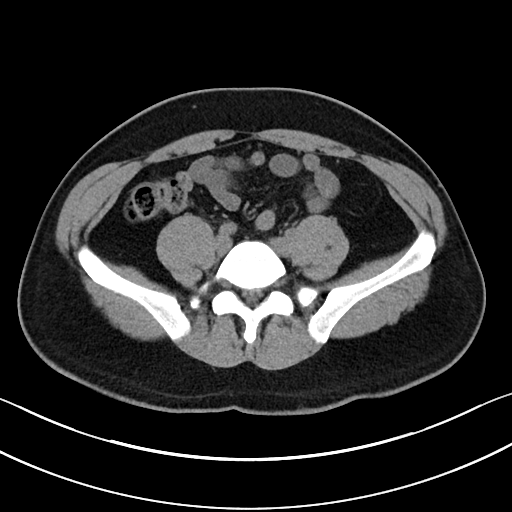
[im 51/97  soft-tissue]
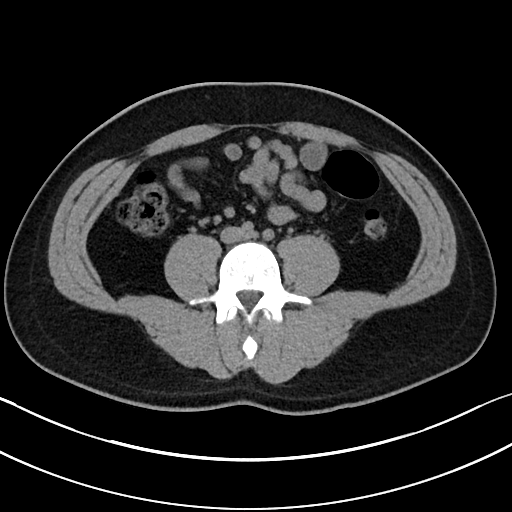
[im 55/97  soft-tissue]
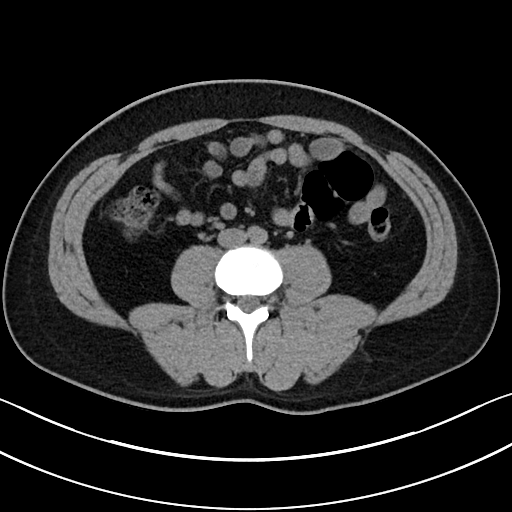
[im 65/97  soft-tissue]
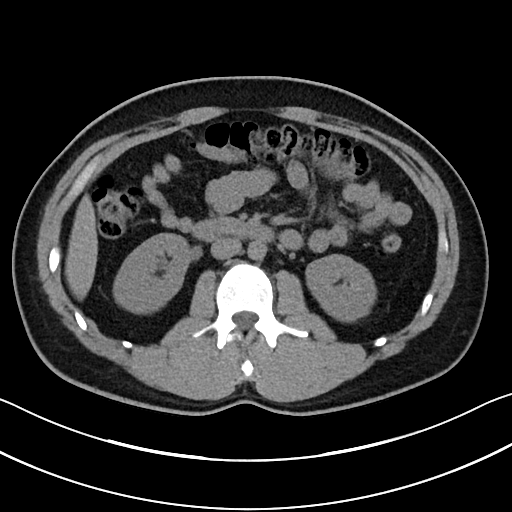
[im 65/97  bone]
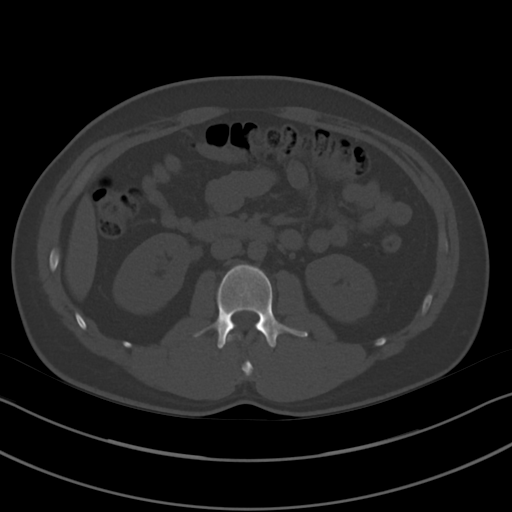
[im 69/97  soft-tissue]
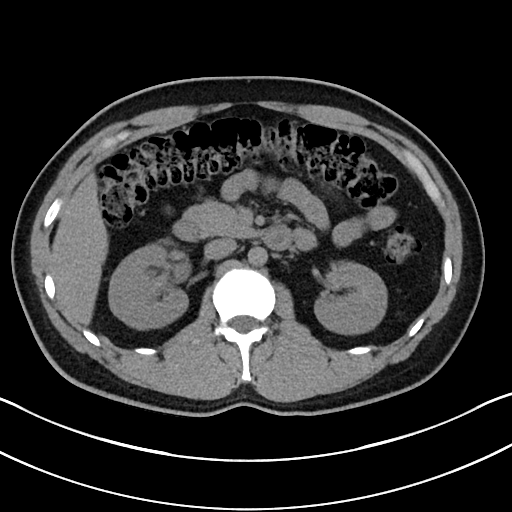
[im 78/97  soft-tissue]
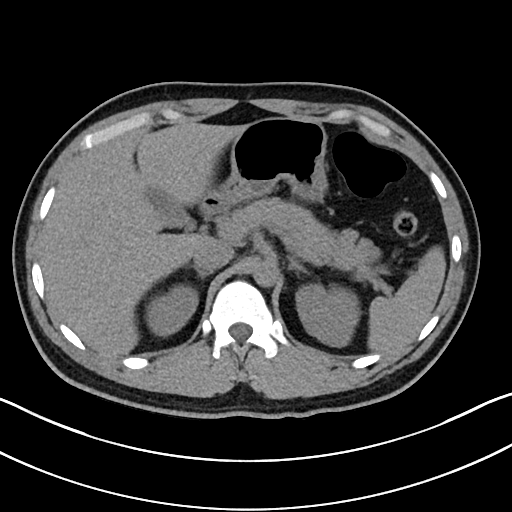
[im 83/97  soft-tissue]
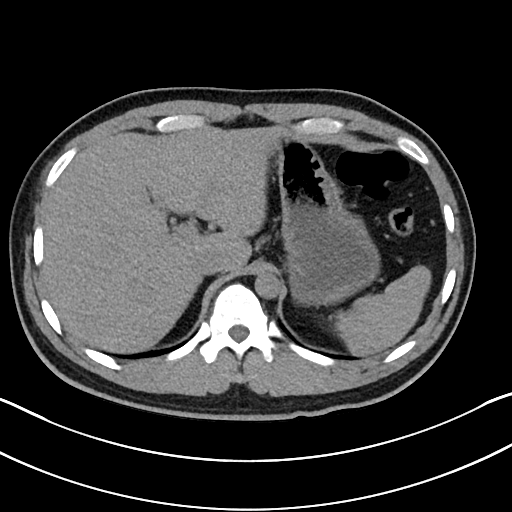
[im 92/97  soft-tissue]
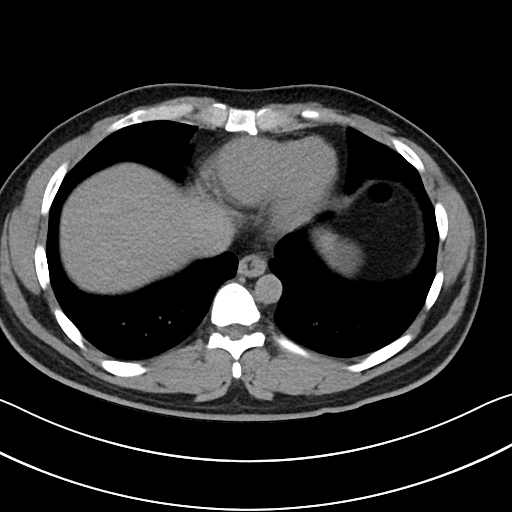

[Series 6: cor · coronal · 0.84mm/px · 3 of 84 slices shown]
[im 28/84  soft-tissue]
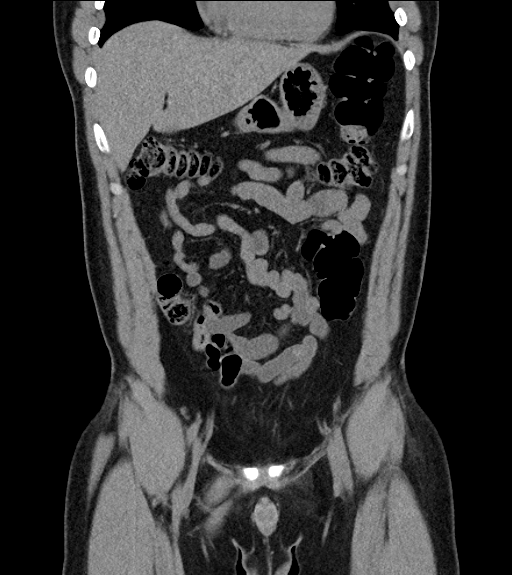
[im 37/84  soft-tissue]
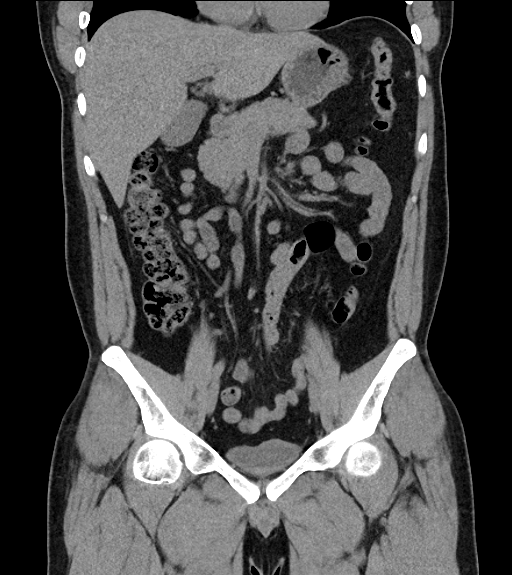
[im 47/84  soft-tissue]
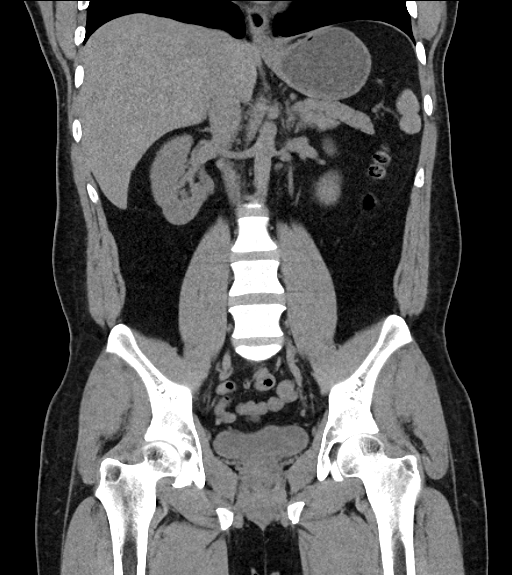

[16 of 46 positions shown; findings below may reference images not displayed]

FINDINGS: Lower chest: No acute abnormality.

Hepatobiliary: No focal liver abnormality is seen. No gallstones,
gallbladder wall thickening, or biliary dilatation.

Pancreas: Unremarkable. No pancreatic ductal dilatation or
surrounding inflammatory changes.

Spleen: Normal in size without focal abnormality.

Adrenals/Urinary Tract: Unremarkable appearance of the adrenal
glands.

Unremarkable left kidney without evidence of hydronephrosis.

There is mild dilation of the right collecting system and the right
ureter with a small ureteral stone just beyond the ureteropelvic
junction.

Unremarkable appearance of the urinary bladder.

Stomach/Bowel: Unremarkable appearance of stomach. Unremarkable
small bowel. Normal appendix. Colonic diverticular disease without
evidence of associated inflammatory changes.

Vascular/Lymphatic: Unremarkable appearance of the vasculature. No
adenopathy.

Reproductive: Unremarkable pelvic structures

Other: Small fat containing umbilical hernia

Musculoskeletal: No displaced fracture.  No bony canal narrowing.
IMPRESSION: Small stone in the proximal right ureter measuring 2 mm. There is
mild right-sided hydronephrosis. If there is concern for ascending
urinary tract infection, recommend correlation with urinalysis.

## 2017-03-25 MED ORDER — HYDROCODONE-ACETAMINOPHEN 5-325 MG PO TABS
ORAL_TABLET | ORAL | Status: AC
  Filled 2017-03-25: qty 1

## 2017-03-25 MED ORDER — HYDROMORPHONE HCL 1 MG/ML IJ SOLN
1.0000 mg | Freq: Once | INTRAMUSCULAR | Status: AC
  Administered 2017-03-25: 1 mg via INTRAVENOUS
  Filled 2017-03-25: qty 1

## 2017-03-25 MED ORDER — HYDROCODONE-ACETAMINOPHEN 5-325 MG PO TABS: 1.0000 | ORAL_TABLET | Freq: Once | ORAL | Status: DC

## 2017-03-25 MED ORDER — ONDANSETRON 8 MG PO TBDP: 8.0000 mg | ORAL_TABLET | Freq: Three times a day (TID) | ORAL | 0 refills | Status: DC | PRN

## 2017-03-25 MED ORDER — OXYCODONE-ACETAMINOPHEN 5-325 MG PO TABS: 1.0000 | ORAL_TABLET | ORAL | 0 refills | Status: DC | PRN

## 2017-03-25 MED ORDER — IBUPROFEN 600 MG PO TABS: 600.0000 mg | ORAL_TABLET | Freq: Three times a day (TID) | ORAL | 0 refills | Status: DC | PRN

## 2017-03-25 MED ORDER — KETOROLAC TROMETHAMINE 30 MG/ML IJ SOLN
30.0000 mg | Freq: Once | INTRAMUSCULAR | Status: AC
  Administered 2017-03-25: 30 mg via INTRAVENOUS
  Filled 2017-03-25: qty 1

## 2017-03-25 NOTE — ED Triage Notes (Signed)
Rt flank and back pain hurts to void since this am,  Has voided x 2 this am

## 2017-03-27 NOTE — ED Provider Notes (Signed)
[facility]PT Provider Note   CSN: [id number] Arrival date & time: 03/25/17  1734     History   Chief Complaint Chief Complaint  Patient presents with  . Flank Pain    HPI Daniel Wang is a 31 y.o. male.  HPI Patient presents the emergency department with right flank pain that radiates to the right groin since this morning.  No pr history kidney stones.  Reports nausea without vomiting.  Denies diarrhea.  No fevers or chills.  Denies dysuria and urinary frequency.pain is moderate in severity   History reviewed. No pertinent past medical history.  There are no active problems to display for this patient.   History reviewed. No pertinent surgical history.     Home Medications    Prior to Admission medications   Medication Sig Start Date End Date Taking? Authorizing Provider  amoxicillin-clavulanate (AUGMENTIN) 875-125 MG tablet Take 1 tablet by mouth 2 (two) times daily. 11/16/15   Weber, Dema SeverinSarah L, PA-C  famotidine (PEPCID) 20 MG tablet Take 1 tablet (20 mg total) by mouth 2 (two) times daily. 04/07/16   Renne CriglerGeiple, Joshua, PA-C  fluticasone (FLONASE) 50 MCG/ACT nasal spray Place 2 sprays into both nostrils daily. 11/16/15   Weber, Dema SeverinSarah L, PA-C  ibuprofen (ADVIL,MOTRIN) 600 MG tablet Take 1 tablet (600 mg total) by mouth every 8 (eight) hours as needed. 03/25/17   Azalia Bilisampos, Kiley Solimine, MD  ondansetron (ZOFRAN ODT) 8 MG disintegrating tablet Take 1 tablet (8 mg total) by mouth every 8 (eight) hours as needed for nausea or vomiting. 03/25/17   Azalia Bilisampos, Jamill Wetmore, MD  oxyCODONE-acetaminophen (PERCOCET/ROXICET) 5-325 MG tablet Take 1 tablet by mouth every 4 (four) hours as needed for severe pain. 03/25/17   Azalia Bilisampos, Justis Closser, MD    Family History No family history on file.  Social History Social History  Substance Use Topics  . Smoking status: Never Smoker  . Smokeless tobacco: Never Used  . Alcohol use Yes     Allergies   Patient has no known allergies.   Review of Systems Review of  Systems  All other systems reviewed and are negative.    Physical Exam Updated Vital Signs BP 111/76   Pulse 77   Temp 98.1 F (36.7 C) (Oral)   Resp (!) 24   Ht 5\' 2"  (1.575 m)   Wt 72.6 kg (160 lb)   SpO2 97%   BMI 29.26 kg/m   Physical Exam  Constitutional: He is oriented to person, place, and time. He appears well-developed and well-nourished.  HENT:  Head: Normocephalic and atraumatic.  Eyes: EOM are normal.  Neck: Normal range of motion.  Cardiovascular: Normal rate, regular rhythm, normal heart sounds and intact distal pulses.   Pulmonary/Chest: Effort normal and breath sounds normal. No respiratory distress.  Abdominal: Soft. He exhibits no distension. There is no tenderness.  Musculoskeletal: Normal range of motion.  Neurological: He is alert and oriented to person, place, and time.  Skin: Skin is warm and dry.  Psychiatric: He has a normal mood and affect. Judgment normal.  Nursing note and vitals reviewed.    ED Treatments / Results  Labs (all labs ordered are listed, but only abnormal results are displayed) Labs Reviewed  BASIC METABOLIC PANEL - Abnormal; Notable for the following:       Result Value   Glucose, Bld 125 (*)    All other components within normal limits  CBC    EKG  EKG Interpretation None       Radiology Ct  Renal Stone Study  Result Date: 03/25/2017 CLINICAL DATA:  31 year old male with a history of right-sided flank pain EXAM: CT ABDOMEN AND PELVIS WITHOUT CONTRAST TECHNIQUE: Multidetector CT imaging of the abdomen and pelvis was performed following the standard protocol without IV contrast. COMPARISON:  None. FINDINGS: Lower chest: No acute abnormality. Hepatobiliary: No focal liver abnormality is seen. No gallstones, gallbladder wall thickening, or biliary dilatation. Pancreas: Unremarkable. No pancreatic ductal dilatation or surrounding inflammatory changes. Spleen: Normal in size without focal abnormality. Adrenals/Urinary Tract:  Unremarkable appearance of the adrenal glands. Unremarkable left kidney without evidence of hydronephrosis. There is mild dilation of the right collecting system and the right ureter with a small ureteral stone just beyond the ureteropelvic junction. Unremarkable appearance of the urinary bladder. Stomach/Bowel: Unremarkable appearance of stomach. Unremarkable small bowel. Normal appendix. Colonic diverticular disease without evidence of associated inflammatory changes. Vascular/Lymphatic: Unremarkable appearance of the vasculature. No adenopathy. Reproductive: Unremarkable pelvic structures Other: Small fat containing umbilical hernia Musculoskeletal: No displaced fracture.  No bony canal narrowing. IMPRESSION: Small stone in the proximal right ureter measuring 2 mm. There is mild right-sided hydronephrosis. If there is concern for ascending urinary tract infection, recommend correlation with urinalysis. Electronically Signed   By: Gilmer MorJaime  Wagner D.O.   On: 03/25/2017 18:43    Procedures Procedures (including critical care time)  Medications Ordered in ED Medications  HYDROmorphone (DILAUDID) injection 1 mg (1 mg Intravenous Given 03/25/17 1819)  ketorolac (TORADOL) 30 MG/ML injection 30 mg (30 mg Intravenous Given 03/25/17 1819)     Initial Impression / Assessment and Plan / ED Course  I have reviewed the triage vital signs and the nursing notes.  Pertinent labs & imaging results that were available during my care of the patient were reviewed by me and considered in my medical decision making (see chart for details).     Pain is controlled in the emergency department.  Proximal right ureteral stone.  Discharge home with standard stone precautions.  Urology follow-up.  He understands return to the ER for new or worsening symptoms  Final Clinical Impressions(s) / ED Diagnoses   Final diagnoses:  Right ureteral stone    New Prescriptions Discharge Medication List as of 03/25/2017  8:29 PM      START taking these medications   Details  ibuprofen (ADVIL,MOTRIN) 600 MG tablet Take 1 tablet (600 mg total) by mouth every 8 (eight) hours as needed., Starting Wed 03/25/2017, Print    ondansetron (ZOFRAN ODT) 8 MG disintegrating tablet Take 1 tablet (8 mg total) by mouth every 8 (eight) hours as needed for nausea or vomiting., Starting Wed 03/25/2017, Print    oxyCODONE-acetaminophen (PERCOCET/ROXICET) 5-325 MG tablet Take 1 tablet by mouth every 4 (four) hours as needed for severe pain., Starting Wed 03/25/2017, Print         Azalia Bilisampos, Jonthan Leite, MD 03/27/17 [phone]

## 2017-05-01 ENCOUNTER — Other Ambulatory Visit: Payer: Self-pay | Admitting: Urology

## 2017-05-01 DIAGNOSIS — N201 Calculus of ureter: Secondary | ICD-10-CM

## 2017-05-05 ENCOUNTER — Ambulatory Visit (HOSPITAL_COMMUNITY)
Admission: RE | Admit: 2017-05-05 | Discharge: 2017-05-05 | Disposition: A | Discharge status: Home / Self Care | Payer: Self-pay | Source: Ambulatory Visit | Attending: Urology | Admitting: Urology

## 2017-05-05 DIAGNOSIS — N202 Calculus of kidney with calculus of ureter: Secondary | ICD-10-CM | POA: Insufficient documentation

## 2017-05-05 DIAGNOSIS — N201 Calculus of ureter: Secondary | ICD-10-CM

## 2017-05-05 IMAGING — CT CT ABD-PELV W/O CM
2 of 4 series · 16 of 46 positions shown, 18 images · non-contrast
Comparison: [DATE]

CLINICAL DATA: Kidney stone.

EXAM:
CT ABDOMEN AND PELVIS WITHOUT CONTRAST
TECHNIQUE: Multidetector CT imaging of the abdomen and pelvis was performed
following the standard protocol without IV contrast.

[Series 2: axial st · axial · 0.79mm/px · z∈[-803,-378]mm · 13 of 95 slices shown, 15 images]
[im 5/95  soft-tissue]
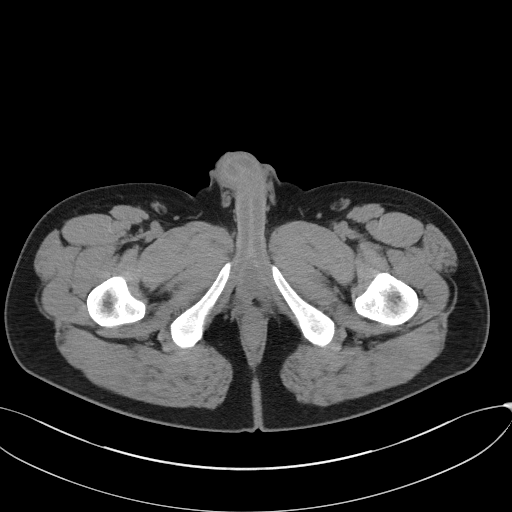
[im 5/95  bone]
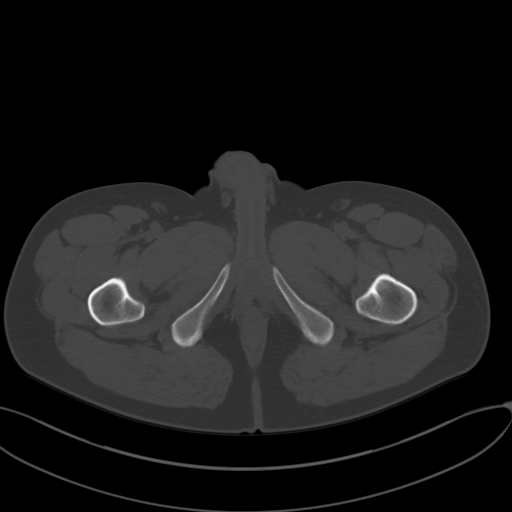
[im 15/95  soft-tissue]
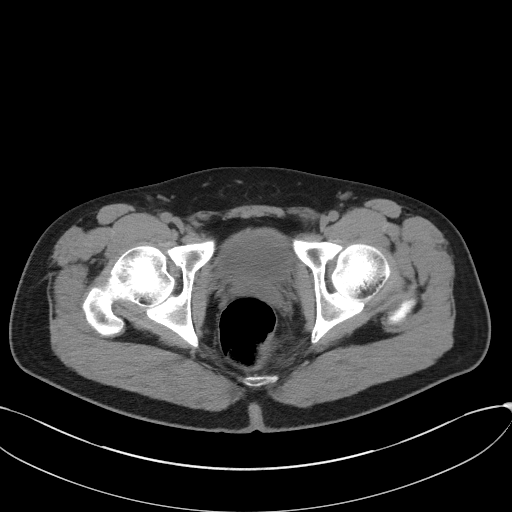
[im 20/95  soft-tissue]
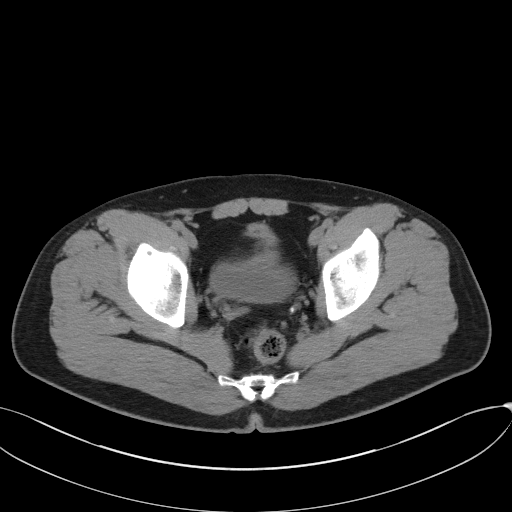
[im 25/95  soft-tissue]
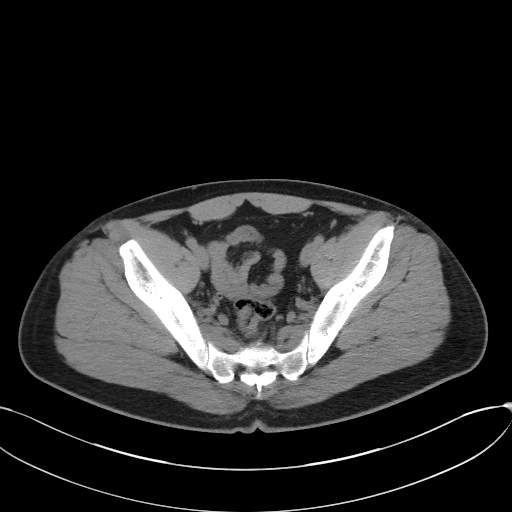
[im 35/95  soft-tissue]
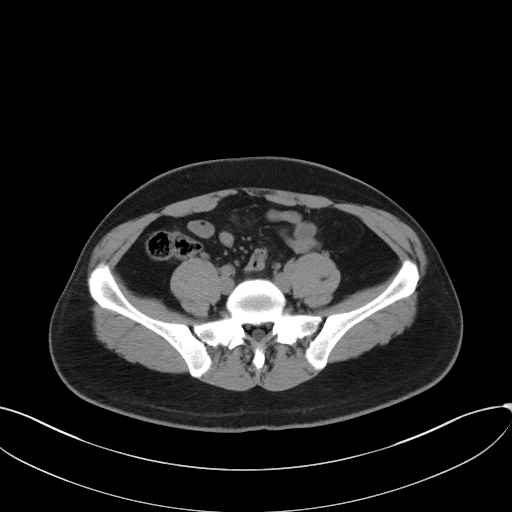
[im 40/95  soft-tissue]
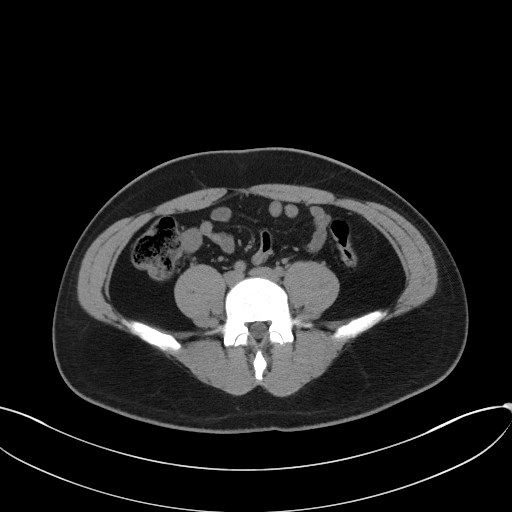
[im 50/95  soft-tissue]
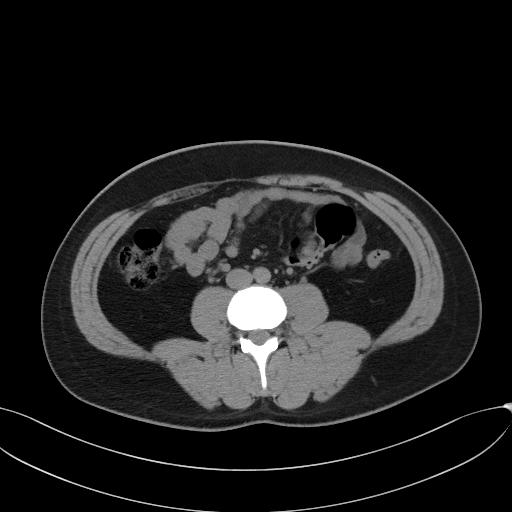
[im 55/95  soft-tissue]
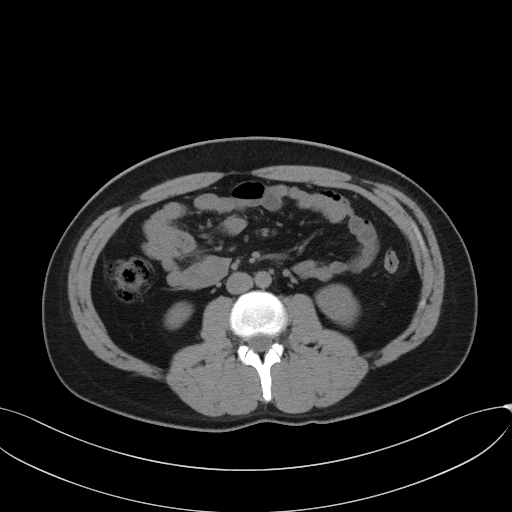
[im 60/95  soft-tissue]
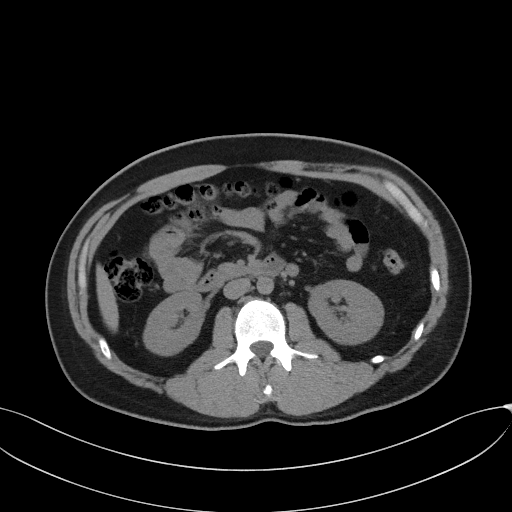
[im 60/95  bone]
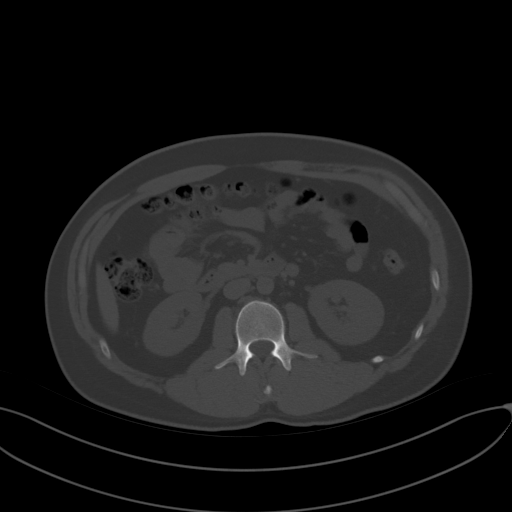
[im 70/95  soft-tissue]
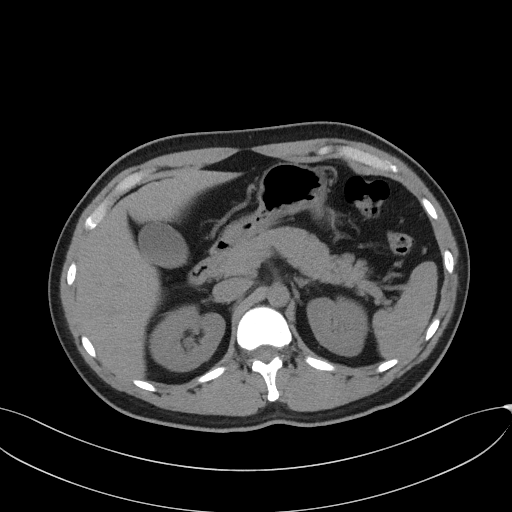
[im 75/95  soft-tissue]
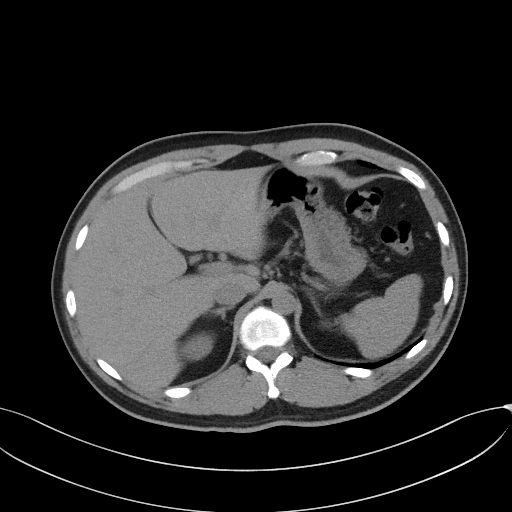
[im 80/95  soft-tissue]
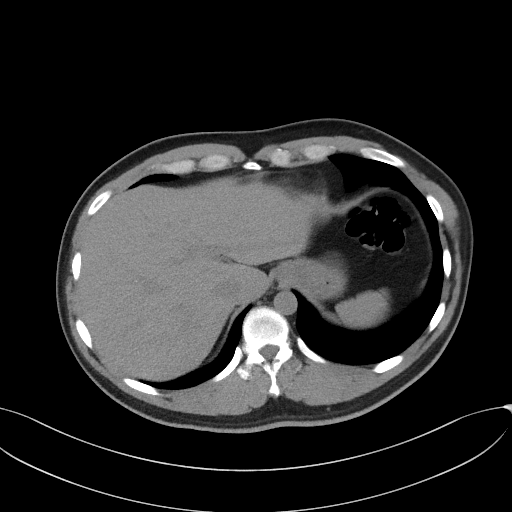
[im 90/95  soft-tissue]
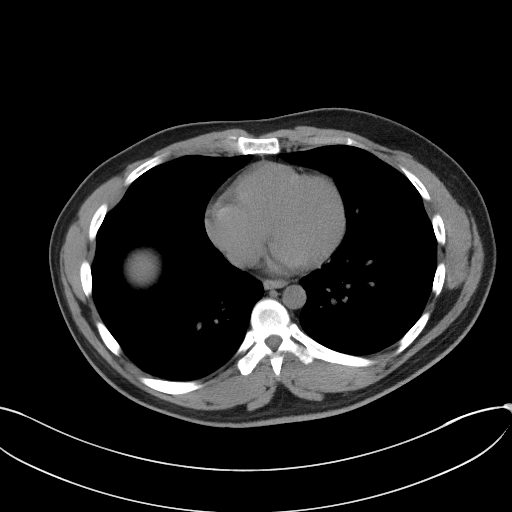

[Series 5: coronal · coronal · 0.76mm/px · 3 of 128 slices shown]
[im 43/128  soft-tissue]
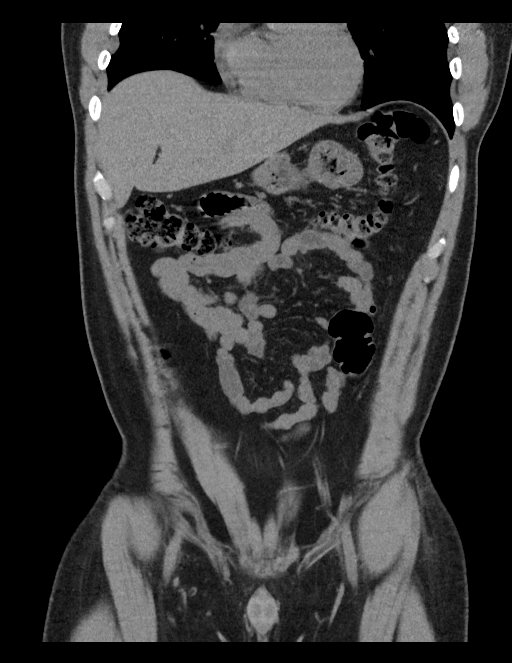
[im 57/128  soft-tissue]
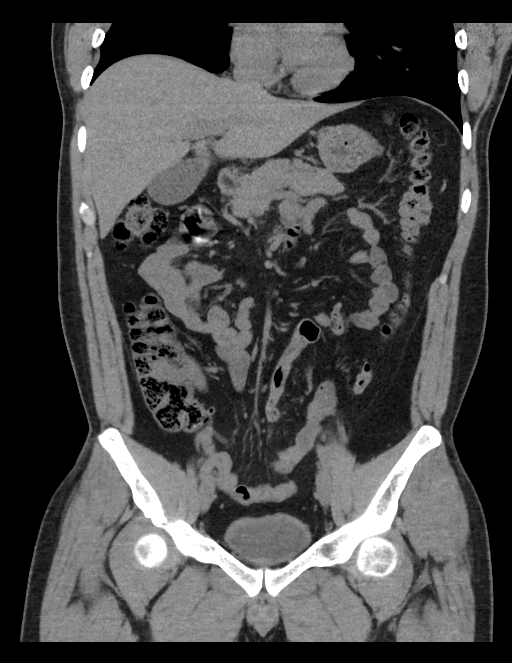
[im 71/128  soft-tissue]
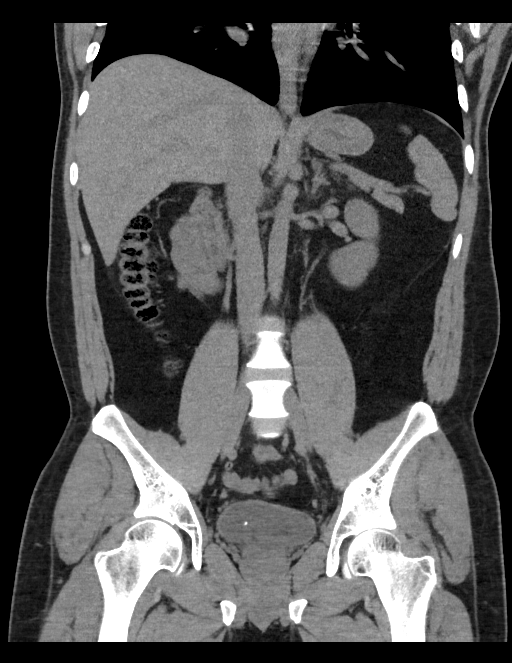

[16 of 46 positions shown; findings below may reference images not displayed]

FINDINGS: Lower chest:  Unremarkable.

Hepatobiliary: No focal abnormality in the liver on this study
without intravenous contrast. There is no evidence for gallstones,
gallbladder wall thickening, or pericholecystic fluid. No
intrahepatic or extrahepatic biliary dilation.

Pancreas: No focal mass lesion. No dilatation of the main duct. No
intraparenchymal cyst. No peripancreatic edema.

Spleen: No splenomegaly. No focal mass lesion.

Adrenals/Urinary Tract: No adrenal nodule or mass.

The 2 mm stone seen in the proximal right ureter on the prior study
has migrated down to the level of the right UPJ on the current exam.
No right-sided hydroureteronephrosis. Left kidney and ureter
unremarkable.

Stomach/Bowel: Stomach is nondistended. No gastric wall thickening.
No evidence of outlet obstruction. Duodenum is normally positioned
as is the ligament of Treitz. No small bowel wall thickening. No
small bowel dilatation. The terminal ileum is normal. The appendix
is normal. No gross colonic mass. No colonic wall thickening. No
substantial diverticular change.

Vascular/Lymphatic: No abdominal aortic aneurysm. No abdominal
aortic atherosclerotic calcification. There is no gastrohepatic or
hepatoduodenal ligament lymphadenopathy. No intraperitoneal or
retroperitoneal lymphadenopathy. No pelvic sidewall lymphadenopathy.

Reproductive: The prostate gland and seminal vesicles have normal
imaging features.

Other: No intraperitoneal free fluid.

Musculoskeletal: Bone windows reveal no worrisome lytic or sclerotic
osseous lesions.
IMPRESSION: 1. Interval migration of the right ureteral stone down to the level
of the right UVJ. No hydroureteronephrosis at this time.

## 2017-05-20 ENCOUNTER — Other Ambulatory Visit: Payer: Self-pay | Admitting: Urology

## 2017-05-20 NOTE — Patient Instructions (Signed)
Daniel Wang  05/20/2017   Your procedure is scheduled on: 05/22/2017    Report to [facility] Main  Entrance Take [place]  elevators to 3rd floor to  Short Stay Center at   115 pm    Call this number if you have problems the morning of surgery [phone]    Remember: ONLY 1 PERSON MAY GO WITH YOU TO SHORT STAY TO GET  READY MORNING OF YOUR SURGERY.  Do not eat food after midnite.  May have clear liquids from 12 midnite until 0900am morning of surgery then nothing by mouth.       Take these medicines the morning of surgery with A SIP OF WATER: none                                 You may not have any metal on your body including hair pins and              piercings  Do not wear jewelry, lotions, powders or perfumes, deodorant                   Men may shave face and neck.   Do not bring valuables to the hospital. [facility] IS NOT             RESPONSIBLE   FOR VALUABLES.  Contacts, dentures or bridgework may not be worn into surgery.      Patients discharged the day of surgery will not be allowed to drive home.  Name and phone number of your driver:     CLEAR LIQUID DIET   Foods Allowed                                                                     Foods Excluded  Coffee and tea, regular and decaf                             liquids that you cannot  Plain Jell-O in any flavor                                             see through such as: Fruit ices (not with fruit pulp)                                     milk, soups, orange juice  Iced Popsicles                                    All solid food Carbonated beverages, regular and diet                                    Cranberry, grape and apple juices  Sports drinks like Gatorade Lightly seasoned clear broth or consume(fat free) Sugar, honey syrup  Sample Menu Breakfast                                Lunch                                     Supper Cranberry juice                     Beef broth                            Chicken broth Jell-O                                     Grape juice                           Apple juice Coffee or tea                        Jell-O                                      Popsicle                                                Coffee or tea                        Coffee or tea  _____________________________________________________________________                Please read over the following fact sheets you were given: _____________________________________________________________________             [facility] - Preparing for Surgery Before surgery, you can play an important role.  Because skin is not sterile, your skin needs to be as free of germs as possible.  You can reduce the number of germs on your skin by washing with CHG (chlorahexidine gluconate) soap before surgery.  CHG is an antiseptic cleaner which kills germs and bonds with the skin to continue killing germs even after washing. Please DO NOT use if you have an allergy to CHG or antibacterial soaps.  If your skin becomes reddened/irritated stop using the CHG and inform your nurse when you arrive at Short Stay. Do not shave (including legs and underarms) for at least 48 hours prior to the first CHG shower.  You may shave your face/neck. Please follow these instructions carefully:  1.  Shower with CHG Soap the night before surgery and the  morning of Surgery.  2.  If you choose to wash your hair, wash your hair first as usual with your  normal  shampoo.  3.  After you shampoo, rinse your hair and body thoroughly to remove the  shampoo.  4.  Use CHG as you would any other liquid soap.  You can apply chg directly  to the skin and wash                       Gently with a scrungie or clean washcloth.  5.  Apply the CHG Soap to your body ONLY FROM THE NECK DOWN.   Do not use on face/ open                           Wound or open sores. Avoid contact with  eyes, ears mouth and genitals (private parts).                       Wash face,  Genitals (private parts) with your normal soap.             6.  Wash thoroughly, paying special attention to the area where your surgery  will be performed.  7.  Thoroughly rinse your body with warm water from the neck down.  8.  DO NOT shower/wash with your normal soap after using and rinsing off  the CHG Soap.                9.  Pat yourself dry with a clean towel.            10.  Wear clean pajamas.            11.  Place clean sheets on your bed the night of your first shower and do not  sleep with pets. Day of Surgery : Do not apply any lotions/deodorants the morning of surgery.  Please wear clean clothes to the hospital/surgery center.  FAILURE TO FOLLOW THESE INSTRUCTIONS MAY RESULT IN THE CANCELLATION OF YOUR SURGERY PATIENT SIGNATURE_________________________________  NURSE SIGNATURE__________________________________  ________________________________________________________________________

## 2017-05-21 ENCOUNTER — Other Ambulatory Visit: Payer: Self-pay | Admitting: Urology

## 2017-05-21 ENCOUNTER — Encounter (INDEPENDENT_AMBULATORY_CARE_PROVIDER_SITE_OTHER): Payer: Self-pay

## 2017-05-21 ENCOUNTER — Encounter (HOSPITAL_COMMUNITY): Payer: Self-pay

## 2017-05-21 ENCOUNTER — Encounter (HOSPITAL_COMMUNITY)
Admission: RE | Admit: 2017-05-21 | Discharge: 2017-05-21 | Disposition: A | Discharge status: Home / Self Care | Payer: Self-pay | Source: Ambulatory Visit | Attending: Urology | Admitting: Urology

## 2017-05-21 DIAGNOSIS — N200 Calculus of kidney: Secondary | ICD-10-CM | POA: Insufficient documentation

## 2017-05-21 DIAGNOSIS — Z01812 Encounter for preprocedural laboratory examination: Secondary | ICD-10-CM | POA: Insufficient documentation

## 2017-05-21 HISTORY — DX: Headache, unspecified: R51.9

## 2017-05-21 HISTORY — DX: Gastro-esophageal reflux disease without esophagitis: K21.9

## 2017-05-21 HISTORY — DX: Headache: R51

## 2017-05-21 HISTORY — DX: Personal history of urinary calculi: Z87.442

## 2017-05-21 LAB — CBC
HCT: 43 % (ref 39.0–52.0)
Hemoglobin: 14.8 g/dL (ref 13.0–17.0)
MCH: 30 pg (ref 26.0–34.0)
MCHC: 34.4 g/dL (ref 30.0–36.0)
MCV: 87 fL (ref 78.0–100.0)
PLATELETS: 332 10*3/uL (ref 150–400)
RBC: 4.94 MIL/uL (ref 4.22–5.81)
RDW: 12.9 % (ref 11.5–15.5)
WBC: 5.5 10*3/uL (ref 4.0–10.5)

## 2017-05-21 LAB — BASIC METABOLIC PANEL
Anion gap: 9 (ref 5–15)
BUN: 16 mg/dL (ref 6–20)
CHLORIDE: 106 mmol/L (ref 101–111)
CO2: 24 mmol/L (ref 22–32)
CREATININE: 0.9 mg/dL (ref 0.61–1.24)
Calcium: 9.6 mg/dL (ref 8.9–10.3)
GFR calc Af Amer: >60 mL/min (ref >60)
GFR calc non Af Amer: >60 mL/min (ref >60)
GLUCOSE: 104 mg/dL — AB (ref 65–99)
Potassium: 4.6 mmol/L (ref 3.5–5.1)
Sodium: 139 mmol/L (ref 135–145)

## 2017-05-21 NOTE — Progress Notes (Signed)
Patient reports hx of chest pain at preop in 2017.  Patient states was evaluated and it was due to stress.

## 2017-05-22 ENCOUNTER — Encounter (HOSPITAL_COMMUNITY): Payer: Self-pay | Admitting: Emergency Medicine

## 2017-05-22 ENCOUNTER — Ambulatory Visit (HOSPITAL_COMMUNITY)
Admission: RE | Admit: 2017-05-22 | Discharge: 2017-05-22 | Disposition: A | Discharge status: Home / Self Care | Payer: Self-pay | Source: Ambulatory Visit | Attending: Urology | Admitting: Urology

## 2017-05-22 ENCOUNTER — Ambulatory Visit (HOSPITAL_COMMUNITY): Payer: Self-pay | Admitting: Certified Registered Nurse Anesthetist

## 2017-05-22 ENCOUNTER — Encounter (HOSPITAL_COMMUNITY)
Admission: RE | Disposition: A | Discharge status: Home / Self Care | Payer: Self-pay | Source: Ambulatory Visit | Attending: Urology

## 2017-05-22 ENCOUNTER — Ambulatory Visit (HOSPITAL_COMMUNITY): Payer: Self-pay

## 2017-05-22 DIAGNOSIS — Z87891 Personal history of nicotine dependence: Secondary | ICD-10-CM | POA: Insufficient documentation

## 2017-05-22 DIAGNOSIS — N200 Calculus of kidney: Secondary | ICD-10-CM | POA: Insufficient documentation

## 2017-05-22 DIAGNOSIS — K219 Gastro-esophageal reflux disease without esophagitis: Secondary | ICD-10-CM | POA: Insufficient documentation

## 2017-05-22 HISTORY — PX: CYSTOSCOPY/URETEROSCOPY/HOLMIUM LASER: SHX6545

## 2017-05-22 SURGERY — CYSTOURETEROSCOPY, USING HOLMIUM LASER
Anesthesia: General | Site: Ureter | Laterality: Right

## 2017-05-22 MED ORDER — HYDROMORPHONE HCL-NACL 0.5-0.9 MG/ML-% IV SOSY
PREFILLED_SYRINGE | INTRAVENOUS | Status: AC
  Filled 2017-05-22: qty 1

## 2017-05-22 MED ORDER — PROPOFOL 10 MG/ML IV BOLUS
INTRAVENOUS | Status: AC
  Filled 2017-05-22: qty 20

## 2017-05-22 MED ORDER — OXYCODONE HCL 5 MG PO TABS
5.0000 mg | ORAL_TABLET | Freq: Once | ORAL | Status: AC | PRN
  Administered 2017-05-22: 5 mg via ORAL
  Filled 2017-05-22: qty 1

## 2017-05-22 MED ORDER — ONDANSETRON HCL 4 MG/2ML IJ SOLN
INTRAMUSCULAR | Status: DC | PRN
  Administered 2017-05-22: 4 mg via INTRAVENOUS

## 2017-05-22 MED ORDER — LACTATED RINGERS IV SOLN
INTRAVENOUS | Status: DC
  Administered 2017-05-22 (×3): via INTRAVENOUS

## 2017-05-22 MED ORDER — MIDAZOLAM HCL 2 MG/2ML IJ SOLN
INTRAMUSCULAR | Status: DC | PRN
  Administered 2017-05-22: 2 mg via INTRAVENOUS

## 2017-05-22 MED ORDER — HYDROMORPHONE HCL-NACL 0.5-0.9 MG/ML-% IV SOSY
0.2500 mg | PREFILLED_SYRINGE | INTRAVENOUS | Status: DC | PRN
  Administered 2017-05-22: 0.5 mg via INTRAVENOUS

## 2017-05-22 MED ORDER — LIDOCAINE 2% (20 MG/ML) 5 ML SYRINGE
INTRAMUSCULAR | Status: DC | PRN
  Administered 2017-05-22: 60 mg via INTRAVENOUS

## 2017-05-22 MED ORDER — LIDOCAINE 2% (20 MG/ML) 5 ML SYRINGE
INTRAMUSCULAR | Status: AC
  Filled 2017-05-22: qty 5

## 2017-05-22 MED ORDER — KETOROLAC TROMETHAMINE 30 MG/ML IJ SOLN
30.0000 mg | Freq: Once | INTRAMUSCULAR | Status: AC
  Administered 2017-05-22: 30 mg via INTRAVENOUS
  Filled 2017-05-22: qty 1

## 2017-05-22 MED ORDER — FENTANYL CITRATE (PF) 100 MCG/2ML IJ SOLN
INTRAMUSCULAR | Status: DC | PRN
  Administered 2017-05-22 (×2): 50 ug via INTRAVENOUS
  Administered 2017-05-22: 25 ug via INTRAVENOUS

## 2017-05-22 MED ORDER — PROMETHAZINE HCL 25 MG/ML IJ SOLN: 6.2500 mg | INTRAMUSCULAR | Status: DC | PRN

## 2017-05-22 MED ORDER — FENTANYL CITRATE (PF) 100 MCG/2ML IJ SOLN
INTRAMUSCULAR | Status: AC
  Filled 2017-05-22: qty 2

## 2017-05-22 MED ORDER — ONDANSETRON HCL 4 MG/2ML IJ SOLN
INTRAMUSCULAR | Status: AC
  Filled 2017-05-22: qty 2

## 2017-05-22 MED ORDER — HYDROCODONE-ACETAMINOPHEN 5-325 MG PO TABS: 1.0000 | ORAL_TABLET | Freq: Once | ORAL | Status: DC

## 2017-05-22 MED ORDER — IOHEXOL 300 MG/ML  SOLN
INTRAMUSCULAR | Status: DC | PRN
  Administered 2017-05-22: 20 mL via URETHRAL

## 2017-05-22 MED ORDER — CEFAZOLIN SODIUM-DEXTROSE 2-4 GM/100ML-% IV SOLN
2.0000 g | INTRAVENOUS | Status: AC
  Administered 2017-05-22: 2 g via INTRAVENOUS
  Filled 2017-05-22: qty 100

## 2017-05-22 MED ORDER — MIDAZOLAM HCL 2 MG/2ML IJ SOLN
INTRAMUSCULAR | Status: AC
  Filled 2017-05-22: qty 2

## 2017-05-22 MED ORDER — OXYCODONE HCL 5 MG/5ML PO SOLN: 5.0000 mg | Freq: Once | ORAL | Status: AC | PRN

## 2017-05-22 MED ORDER — PROPOFOL 10 MG/ML IV BOLUS
INTRAVENOUS | Status: DC | PRN
  Administered 2017-05-22: 200 mg via INTRAVENOUS

## 2017-05-22 SURGICAL SUPPLY — 13 items
BAG URO CATCHER STRL LF (MISCELLANEOUS) ×3 IMPLANT
CATH INTERMIT  6FR 70CM (CATHETERS) ×3 IMPLANT
CLOTH BEACON ORANGE TIMEOUT ST (SAFETY) IMPLANT
COVER FOOTSWITCH UNIV (MISCELLANEOUS) ×3 IMPLANT
COVER SURGICAL LIGHT HANDLE (MISCELLANEOUS) IMPLANT
GLOVE BIOGEL M STRL SZ7.5 (GLOVE) ×3 IMPLANT
GOWN STRL REUS W/TWL LRG LVL3 (GOWN DISPOSABLE) ×3 IMPLANT
GUIDEWIRE STR DUAL SENSOR (WIRE) ×6 IMPLANT
MANIFOLD NEPTUNE II (INSTRUMENTS) ×3 IMPLANT
PACK CYSTO (CUSTOM PROCEDURE TRAY) ×3 IMPLANT
STENT CONTOUR 6FRX24X.038 (STENTS) ×3 IMPLANT
TUBING CONNECTING 10 (TUBING) ×2 IMPLANT
TUBING CONNECTING 10' (TUBING) ×1

## 2017-05-22 NOTE — Op Note (Signed)
Preoperative Diagnosis: Right nephrolithiasis  Postoperative Diagnosis:  Same  Procedure(s) Performed:   - Cystourethroscopy - Right ureteroscopy - Right retrograde pyelogram - Right ureteral stent placement - Intraoperative fluoroscopy with interpretation <1hr.   Teaching Surgeon:  Modena Slater, MD  Resident Surgeon:  Budd Palmer, MD  Assistant(s):  None  Anesthesia:  General  Fluids:  See anesthesia record  Estimated blood loss:  0cc  Specimens:  Stone for analysis  Drains:  Right 6Fr x 24cm JJ ureteral stent with dangler  Complications:  None  Indications: 31 y.o. patient with a history of a distal right ureteral stone found on 03/25/17, and seen again on 05/06/17. Presents today for definitive stone management. Denies passage of stone. Risks & benefits of the procedure discussed with the patient, who wishes to proceed.  Findings:  Normal urethra and bladder. Ureteroscopy revealed no stone in the right ureter. In the kidney, we did note 2 <31mm stones in the kidney, we felt would pass easily on their own. Stent placed for drainage.   Radiologic Interpretation of Retrograde Pyelogram: Right retrograde pyelogram demonstrated contrast extravasation, filling defects or evidence of hydroureteronephrosis.  Description:  The patient was correctly identified in the preop holding area where written informed consent as well potential risk and complication reviewed. The patient agreed. They were brought back to the operative suite where a preinduction timeout was performed. Once correct information was verified, general anesthesia was induced. They were then gently placed into dorsal lithotomy position with SCDs in place for VTE prophylaxis. They were prepped and draped in the usual sterile fashion and given appropriate preoperative antibiotics. A second timeout was then performed.   We inserted a 107F rigid cystoscope per urethra with copious lubrication and normal saline irrigation  running. This demonstrated findings as described above.    We cannulated the right ureteral orifice with a sensor wire and the sensor wire was advanced into the expected location of the renal pelvis without difficulty. Next, we performed semirigid URS, which revealed no stone in the mid or distal ureter. We switched to flexible URS, which did reveal two <8mm stones adhered to an interpolar calyx. These were pushed off and will be allowed to pass. We performed a RPG, which revealed no contrast extravasation  At this point we elected to leave a ureteral stent and withdrew our instruments leaving a sensor wire in place. We then advanced a 6Fr x 24cm JJ ureteral stent with dangler with the assistance of a stent pusher under fluoroscopy without difficultly. Sensor wire removal demonstrated satisfactory stent curl proximally in the renal pelvis and distally in the bladder. The bladder was emptied and all instrumentation was removed. The stent string was cut to an appropriate length. The patient was woken up from anesthesia and taken to the recovery unit for routine postoperative care.   Post Op Plan:   1. Patient to remove stent in 5 days.  2. RTC in 6 weeks for reevaluation  Attestation:  Dr. Alvester Morin was present for the entire procedure.

## 2017-05-22 NOTE — Anesthesia Postprocedure Evaluation (Signed)
Anesthesia Post Note  Patient: Daniel Wang  Procedure(s) Performed: Procedure(s) (LRB): CYSTOSCOPY/RIGHT URETEROSCOPY/RIGHT RETROGRADE PYELOGRAM WITH STENT PLACEMENT (Right)     Patient location during evaluation: PACU Anesthesia Type: General Level of consciousness: awake and alert Pain management: pain level controlled Vital Signs Assessment: post-procedure vital signs reviewed and stable Respiratory status: spontaneous breathing, nonlabored ventilation and respiratory function stable Cardiovascular status: blood pressure returned to baseline and stable Postop Assessment: no apparent nausea or vomiting Anesthetic complications: no    Last Vitals:  Vitals:   05/22/17 1802 05/22/17 1815  BP: (!) 139/94   Pulse: 60 (!) 59  Resp: 17 20  Temp:    SpO2: 100% 96%    Last Pain:  Vitals:   05/22/17 1809  TempSrc:   PainSc: 8                  Lowella Curb

## 2017-05-22 NOTE — Transfer of Care (Signed)
Immediate Anesthesia Transfer of Care Note  Patient: Daniel Wang  Procedure(s) Performed: Procedure(s): CYSTOSCOPY/RIGHT URETEROSCOPY/RIGHT RETROGRADE PYELOGRAM WITH STENT PLACEMENT (Right)  Patient Location: PACU  Anesthesia Type:General  Level of Consciousness:  sedated, patient cooperative and responds to stimulation  Airway & Oxygen Therapy:Patient Spontanous Breathing and Patient connected to face mask oxgen  Post-op Assessment:  Report given to PACU RN and Post -op Vital signs reviewed and stable  Post vital signs:  Reviewed and stable  Last Vitals:  Vitals:   05/22/17 1317 05/22/17 1715  BP: 117/77 (!) 136/97  Pulse: 75 86  Resp: 18 12  Temp: 36.9 C (!) 36.4 C  SpO2: 100% 100%    Complications: No apparent anesthesia complications

## 2017-05-22 NOTE — Discharge Instructions (Signed)
1. You may see some blood in the urine and may have some burning with urination for 48-72 hours. You also may notice that you have to urinate more frequently or urgently after your procedure which is normal.  2. You should call should you develop an inability urinate, fever > 101, persistent nausea and vomiting that prevents you from eating or drinking to stay hydrated.  3. If you have a stent, you will likely urinate more frequently and urgently until the stent is removed and you may experience some discomfort/pain in the lower abdomen and flank especially when urinating. You may take pain medication prescribed to you if needed for pain. The hydrocodone is for severe pain that is not controlled with Tylenol or ibuprofen. Oxybutynin can help with bladder spasms (frequency or urgency to urinate, and the tamsulosin can help with stent pain. Remove the stent at home by pulling gently on the string in 5 days. If you have any problems, call the clinic. You may also intermittently have blood in the urine until the stent is removed. 4. You should call the office [phone]) to notify us of any issues

## 2017-05-22 NOTE — Anesthesia Procedure Notes (Signed)
Procedure Name: LMA Insertion Date/Time: 05/22/2017 4:09 PM Performed by: Vanessa Covington Pre-anesthesia Checklist: Emergency Drugs available, Patient identified, Suction available and Patient being monitored Patient Re-evaluated:Patient Re-evaluated prior to induction Oxygen Delivery Method: Circle system utilized Preoxygenation: Pre-oxygenation with 100% oxygen Induction Type: IV induction Ventilation: Mask ventilation without difficulty LMA: LMA with gastric port inserted LMA Size: 4.0 Number of attempts: 1 Placement Confirmation: positive ETCO2 and breath sounds checked- equal and bilateral Tube secured with: Tape Dental Injury: Teeth and Oropharynx as per pre-operative assessment

## 2017-05-22 NOTE — H&P (Signed)
CC: I have kidney stones.  HPI: Daniel Wang is a 31 year-old male established patient who is here for renal calculi.    31 yo M who initially presented 03/26/17 with right flank pain. BMP, CBC wnl (Cr 1.0). CT showed a 57m right proximal ureteral stone with mild hydro. Urine culture negative. Started on MET.   He was still having pain, urgency and gross hematuria. UA with gross blood but no infection.  CT revealed that the stone was persistent.   ALLERGIES: None   MEDICATIONS: Ibuprofen    GU PSH: None   NON-GU PSH: None   GU PMH: Ureteral calculus - 04/16/2017, - 03/26/2017   NON-GU PMH: None   FAMILY HISTORY: 3 daughters - Daughter   SOCIAL HISTORY: Marital Status: Married Preferred Language: English; Ethnicity: ; Race: Other Race Current Smoking Status: Patient has never smoked.   Tobacco Use Assessment Completed:  Used Tobacco in last 30 days?  Social Drinker.  Drinks 1 caffeinated drink per day. 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 No physical deformities. Normally developed. Good grooming.  Neck: Neck symmetrical, not swollen. Normal tracheal position.  Respiratory: No labored breathing, no use of accessory muscles.   Cardiovascular: Normal temperature, normal extremity pulses, no swelling, no varicosities.  Lymphatic: No enlargement of neck, axillae, groin.  Skin: No paleness, no jaundice, no cyanosis. No lesion, no ulcer, no rash.  Neurologic / Psychiatric: Oriented to time, oriented to place, oriented to person. No depression, no anxiety, no agitation.  Gastrointestinal: No mass, no tenderness, no rigidity, non obese abdomen.  Eyes: Normal conjunctivae. Normal eyelids.  Ears, Nose, Mouth, and Throat: Left ear no scars, no lesions, no masses. Right ear no scars, no lesions, no masses. Nose no  scars, no lesions, no masses. Normal hearing. Normal lips.  Musculoskeletal: Normal gait and station of head and neck.     ASSESSMENT:  Right ureteral calculus PLAN:    Proceed with right ureteroscopy, laser lithotripsy and possible stent placement

## 2017-05-22 NOTE — Anesthesia Preprocedure Evaluation (Signed)
Anesthesia Evaluation  Patient identified by MRN, date of birth, ID band Patient awake    Reviewed: Allergy & Precautions, NPO status , Patient's Chart, lab work & pertinent test results  Airway Mallampati: II  TM Distance: >3 FB Neck ROM: Full    Dental no notable dental hx.    Pulmonary neg pulmonary ROS, former smoker,    Pulmonary exam normal breath sounds clear to auscultation       Cardiovascular negative cardio ROS Normal cardiovascular exam Rhythm:Regular Rate:Normal     Neuro/Psych negative neurological ROS  negative psych ROS   GI/Hepatic negative GI ROS, Neg liver ROS, GERD  ,  Endo/Other  negative endocrine ROS  Renal/GU negative Renal ROS  negative genitourinary   Musculoskeletal negative musculoskeletal ROS (+)   Abdominal   Peds negative pediatric ROS (+)  Hematology negative hematology ROS (+)   Anesthesia Other Findings   Reproductive/Obstetrics negative OB ROS                             Anesthesia Physical Anesthesia Plan  ASA: II  Anesthesia Plan: General   Post-op Pain Management:    Induction: Intravenous  PONV Risk Score and Plan: 2 and Ondansetron and Midazolam  Airway Management Planned: LMA and Oral ETT  Additional Equipment:   Intra-op Plan:   Post-operative Plan: Extubation in OR  Informed Consent: I have reviewed the patients History and Physical, chart, labs and discussed the procedure including the risks, benefits and alternatives for the proposed anesthesia with the patient or authorized representative who has indicated his/her understanding and acceptance.   Dental advisory given  Plan Discussed with: CRNA  Anesthesia Plan Comments:         Anesthesia Quick Evaluation

## 2017-05-23 ENCOUNTER — Encounter (HOSPITAL_COMMUNITY): Payer: Self-pay | Admitting: Urology

## 2020-10-24 ENCOUNTER — Ambulatory Visit (INDEPENDENT_AMBULATORY_CARE_PROVIDER_SITE_OTHER): Payer: BC Managed Care – PPO | Admitting: Medical

## 2020-10-24 ENCOUNTER — Ambulatory Visit
Admission: RE | Admit: 2020-10-24 | Discharge: 2020-10-24 | Disposition: A | Discharge status: Home / Self Care | Payer: BC Managed Care – PPO | Source: Ambulatory Visit | Attending: Medical | Admitting: Medical

## 2020-10-24 ENCOUNTER — Other Ambulatory Visit: Payer: Self-pay

## 2020-10-24 ENCOUNTER — Encounter: Payer: Self-pay | Admitting: Medical

## 2020-10-24 VITALS — BP 112/80 | HR 80 | Ht 64.25 in | Wt 166.6 lb

## 2020-10-24 DIAGNOSIS — R079 Chest pain, unspecified: Secondary | ICD-10-CM

## 2020-10-24 DIAGNOSIS — M25551 Pain in right hip: Secondary | ICD-10-CM

## 2020-10-24 DIAGNOSIS — R109 Unspecified abdominal pain: Secondary | ICD-10-CM

## 2020-10-24 DIAGNOSIS — R29898 Other symptoms and signs involving the musculoskeletal system: Secondary | ICD-10-CM

## 2020-10-24 DIAGNOSIS — R519 Headache, unspecified: Secondary | ICD-10-CM | POA: Diagnosis not present

## 2020-10-24 DIAGNOSIS — M542 Cervicalgia: Secondary | ICD-10-CM

## 2020-10-24 DIAGNOSIS — G8929 Other chronic pain: Secondary | ICD-10-CM

## 2020-10-24 IMAGING — CR DG HIP (WITH OR WITHOUT PELVIS) 3-4V BILAT
3 series · 3 of 3 positions shown · non-contrast
Comparison: None.

CLINICAL DATA: Right hip pain

EXAM:
DG HIP (WITH OR WITHOUT PELVIS) 3-4V BILAT

[t pelvis a.p.]
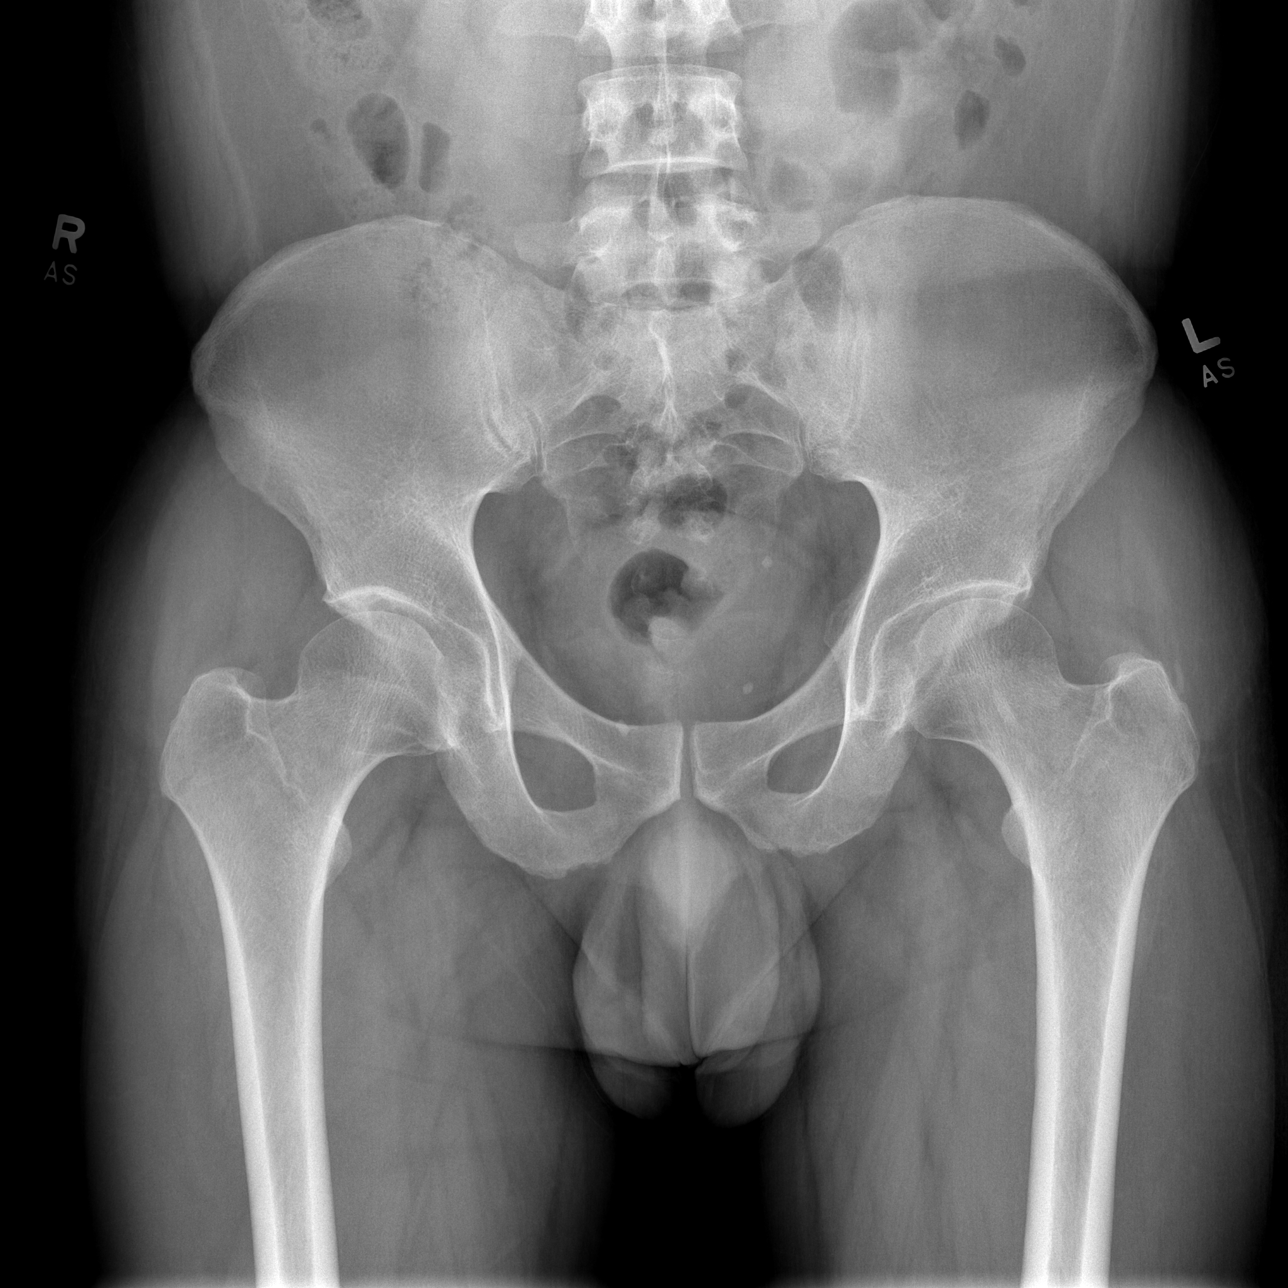

[t hip frog leg left]
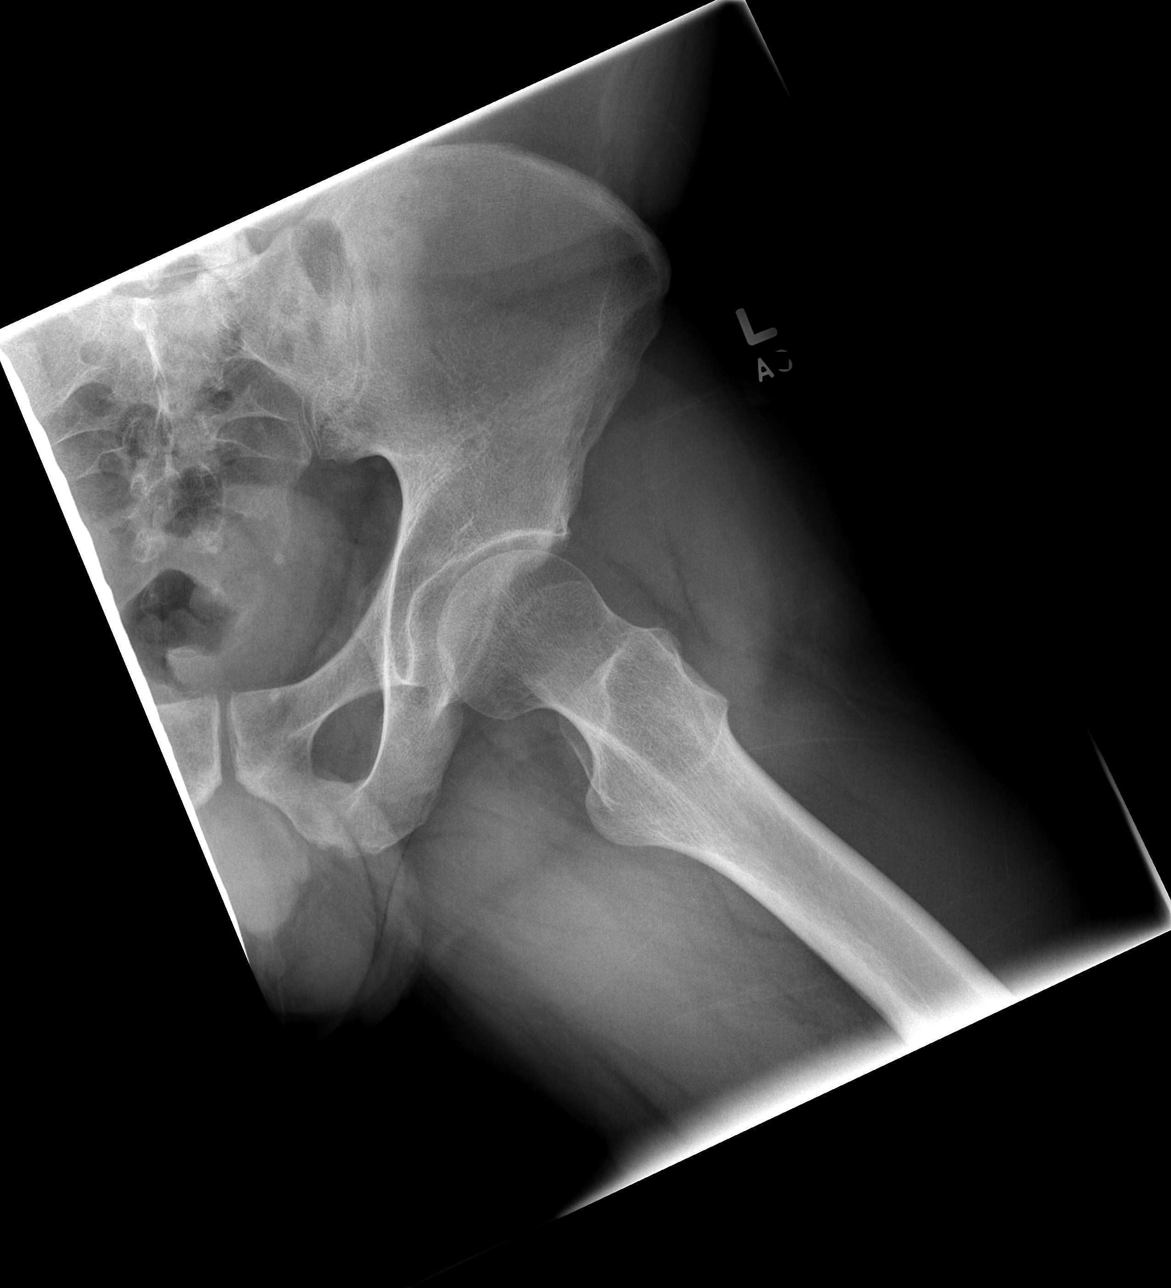

[t hip frog leg right]
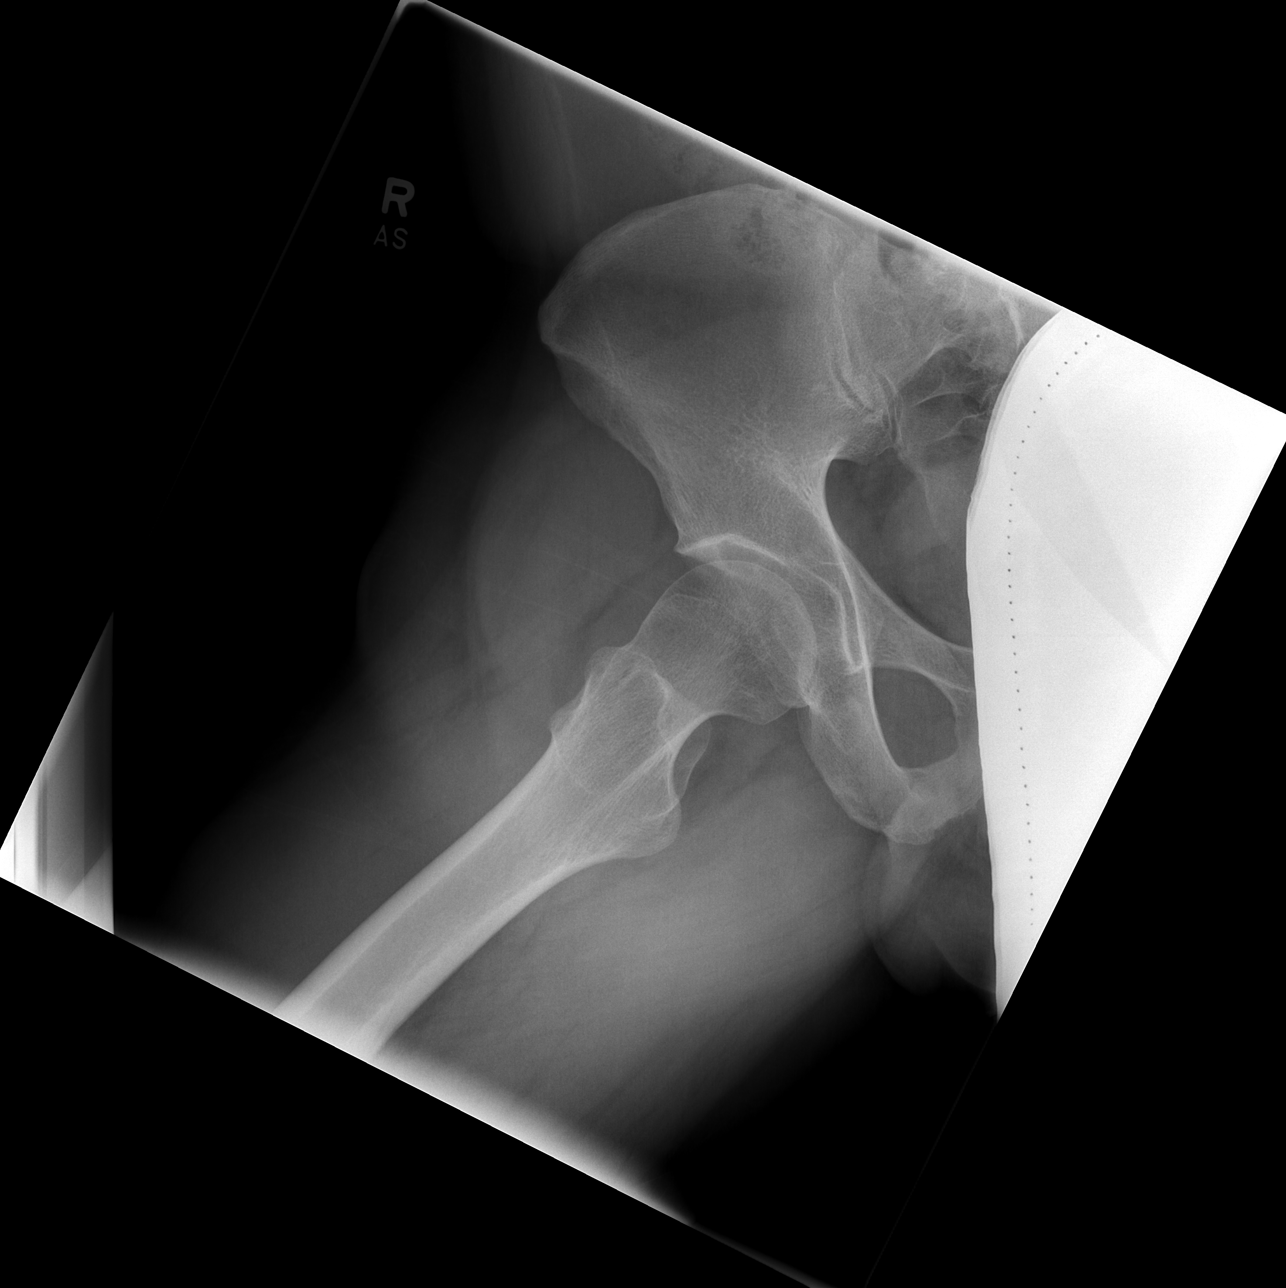

[3 of 3 positions shown; findings below may reference images not displayed]

FINDINGS: There is no evidence of hip fracture or dislocation. There is no
evidence of arthropathy or other focal bone abnormality.
IMPRESSION: Negative.

## 2020-10-24 IMAGING — CR DG CERVICAL SPINE COMPLETE 4+V
5 series · 5 of 5 positions shown · non-contrast
Comparison: None.

CLINICAL DATA: Neck pain, headaches, left arm pain

EXAM:
CERVICAL SPINE - COMPLETE 4+ VIEW

[w c-spine lat]
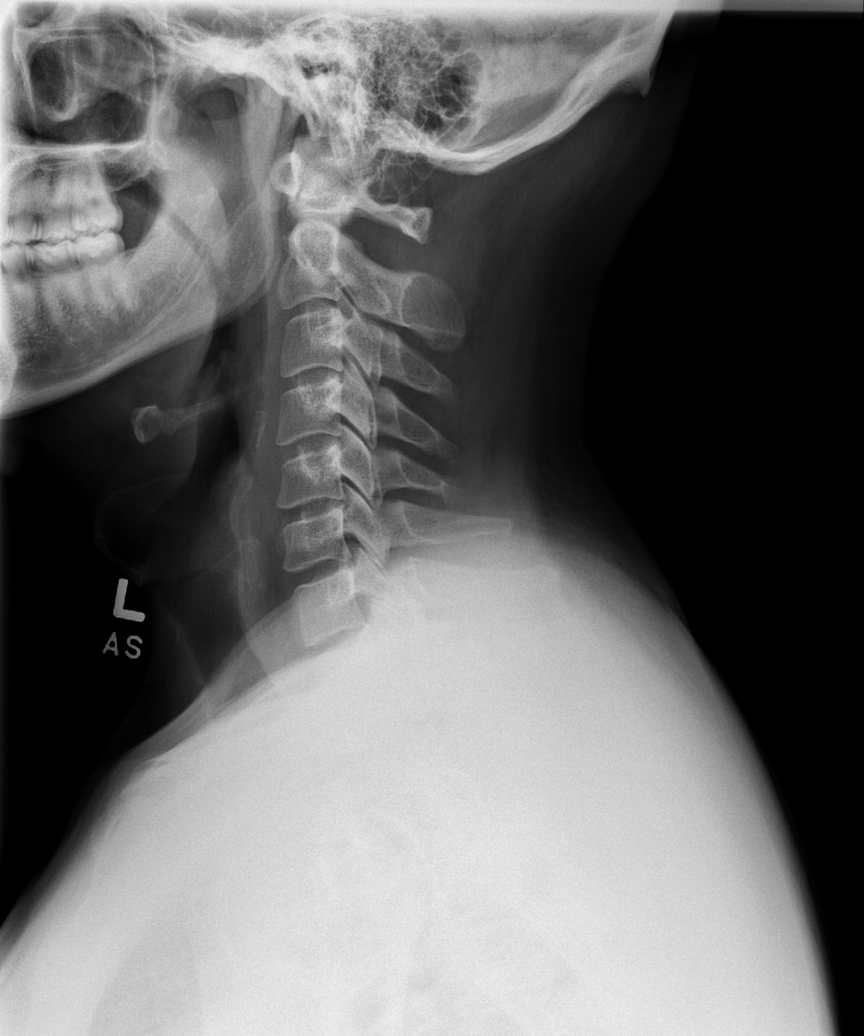

[w c-spine oblique (1 of 2)]
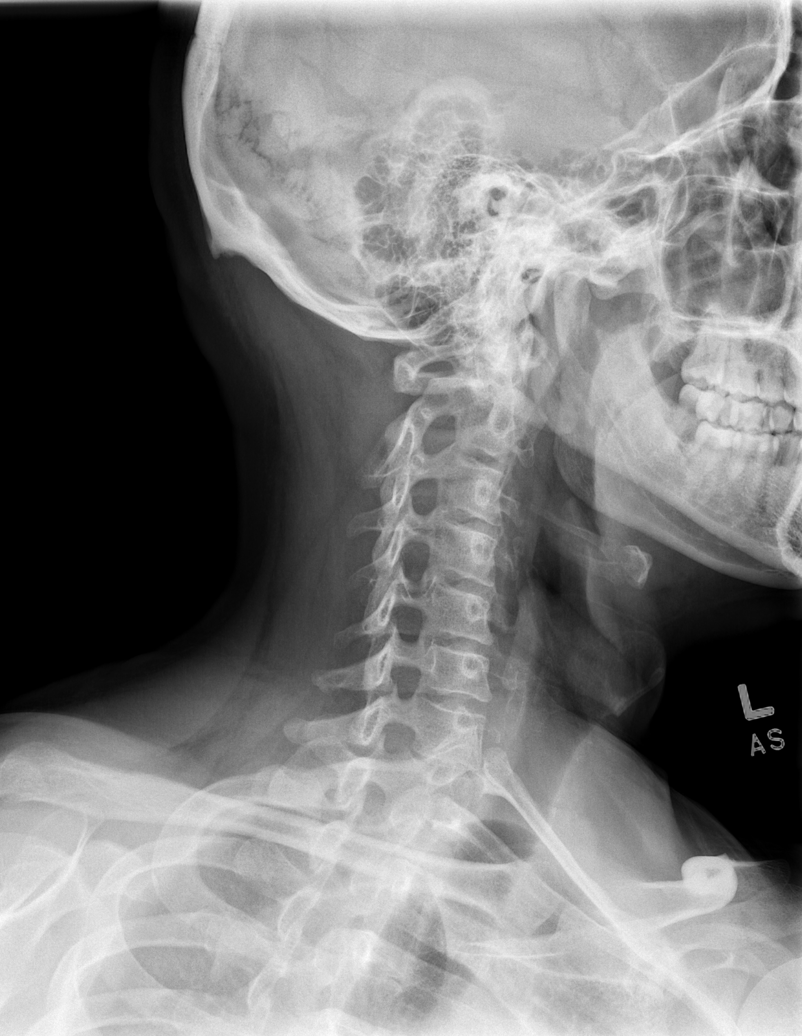

[w c-spine oblique (2 of 2)]
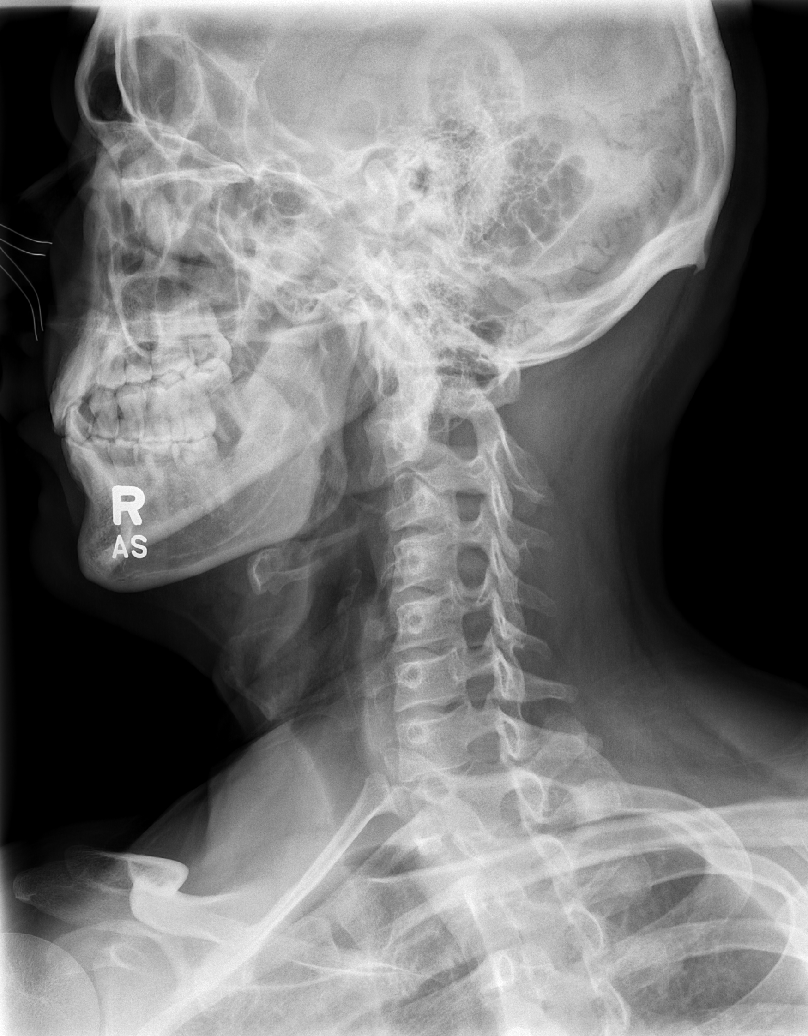

[w c-spine a.p. *]
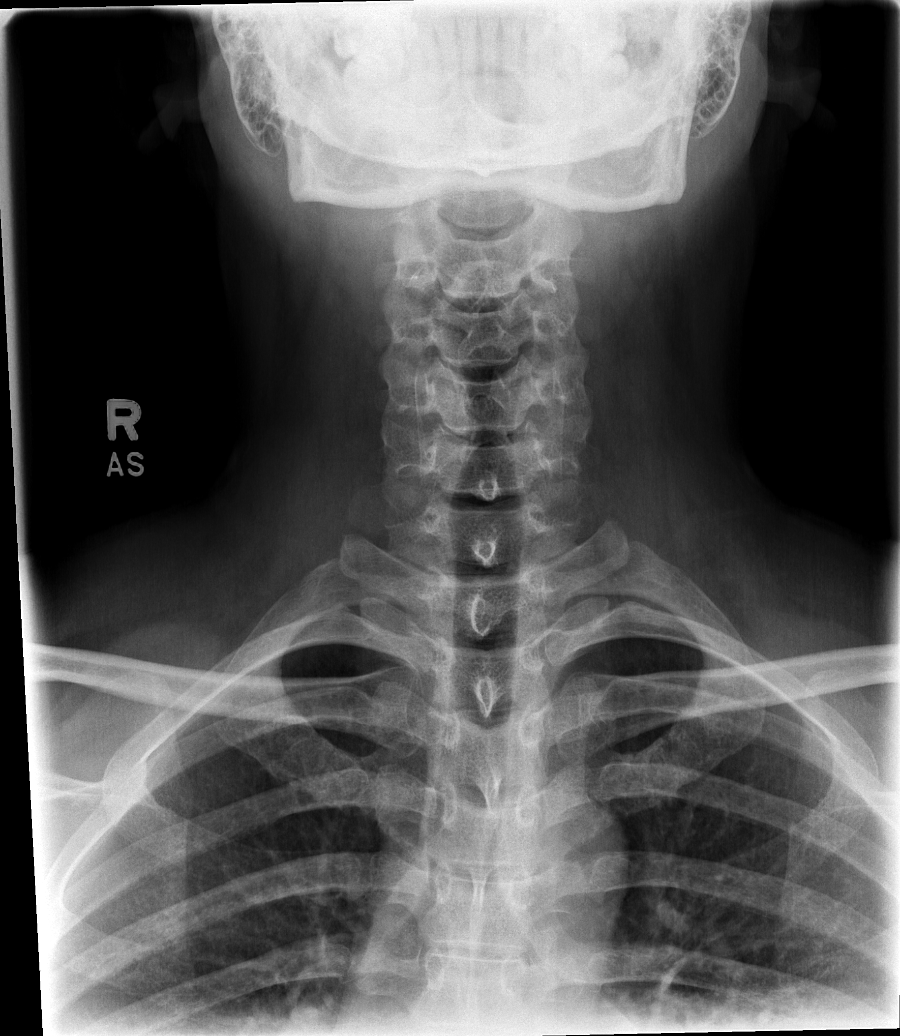

[w c-spine odontoid *]
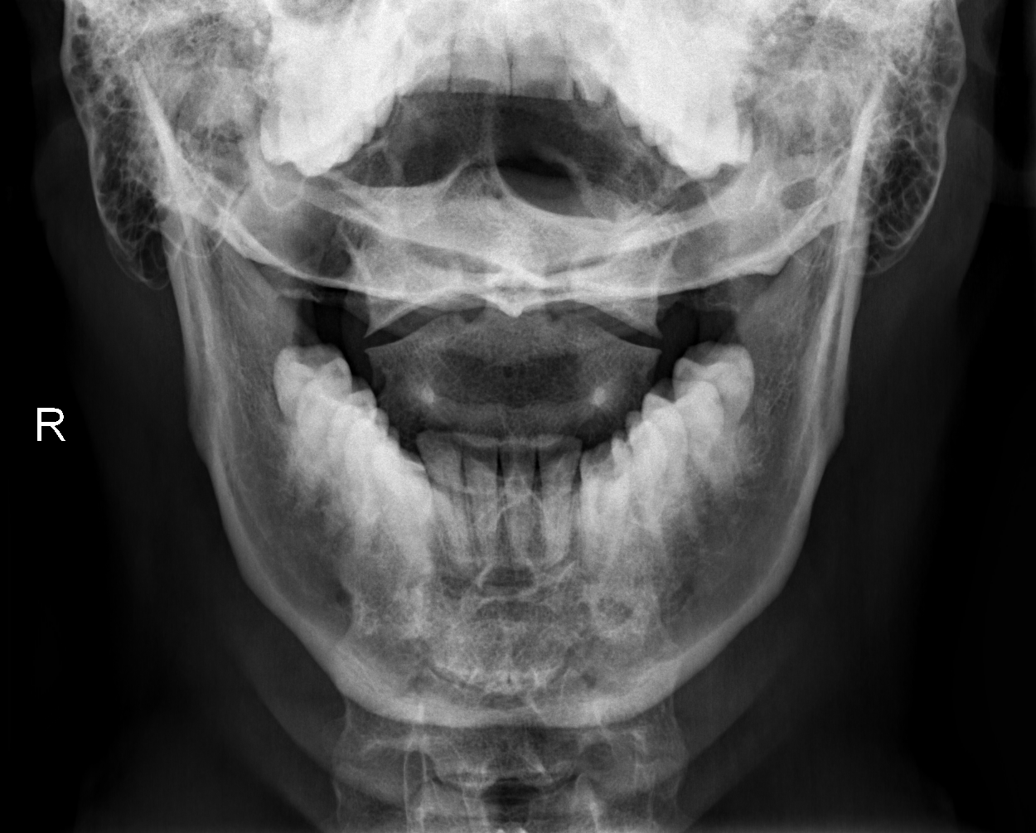

[5 of 5 positions shown; findings below may reference images not displayed]

FINDINGS: No prevertebral soft tissue swelling. Vertebral body heights and
alignment are maintained. No significant disc space narrowing. There
is no uncovertebral or facet hypertrophy encroaching on the neural
foramina. Normal lateral dental interval.
IMPRESSION: Negative cervical spine radiographs.

## 2020-10-24 NOTE — Progress Notes (Signed)
Subjective:  Daniel Wang is a 35 y.o. male who presents for Chief Complaint  Patient presents with  . New Patient (Initial Visit)    Left chest pain radiating down left arm off and on x3-4 weeks. Neck pops often, right side pain. Headaches/Migraine      Here as a new patient to establish care.  Was seeing urge tn care prior prn.  Has a few concerns.  Lately been feeling some pains in left chest that radiates into arm.  Stays for a few seconds then goes away.  Most of the time notices when driving.  Not sure if its related to his gripping steering wheel.  When doing tasks, can get the pain randomly.  Started with chest pain 3-4 weeks ago.  Is occurring 2-3 times per week.  Lasts for seconds each time.  Sometimes gets shoulder pain.  No associated sob, palpitations, nausea, sweat.   Uses the stairs in his building but when he gets to 3rd floor, gets a little winded. Also wearing masks though.  Not sure he has been screened for cholesterol or sugar.  Has some pains in right lower abdomen/side.  Sometimes feels like hip locks up.  Gets pop sound in left hip at times, doesn't hurt but feels weird. Doesn't have same on left side.    Feels like he needs to pop neck often.  Been hurting for months.   Used to work for [employer], worked there 13 years.  Was looking down, rolling burritos.  Wonders if this is attributable to the neck pain.   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    Also has long term wrist pain that he attributes to years of rolling burritos and working typing daily.  Suffers from chronic headaches.  Uses Excedrin.  Stopped this in past after kidney marker abnormality. Uses tylenol or Advil.     Of late having more frequently headaches.  Last week had particularly bad headache in back of head that ultimately radiated to rest of head, missed a day of work due to this.    Wears glasses  Past Medical History:  Diagnosis Date  . GERD (gastroesophageal reflux disease)   . Headache     chronic as of 10/2020  . History of kidney stones   . Wears glasses    No current outpatient medications on file prior to visit.   No current facility-administered medications on file prior to visit.    No other aggravating or relieving factors.    No other c/o.  The following portions of the patient's history were reviewed and updated as appropriate: allergies, current medications, past family history, past medical history, past social history, past surgical history and problem list.  ROS Otherwise as in subjective above  Objective: BP 112/80   Pulse 80   Ht 5' 4.25" (1.632 m)   Wt 166 lb 9.6 oz (75.6 kg)   SpO2 97%   BMI 28.37 kg/m   General appearance: alert, no distress, well developed, well nourished Neck: mild tendnerss over C7 prominence posteriorly, otherwise supple, no lymphadenopathy, no thyromegaly, no masses, normal ROM Heart: RRR, normal S1, S2, no murmurs Lungs: CTA bilaterally, no wheezes, rhonchi, or rales Abdomen: +bs, soft, mild tendnerss in left and right lower quadrante, but otherwise no rebound, no other tender, non distended, no masses, no hepatomegaly, no splenomegaly Back nontender, normal ROM MSK: pop felt with hip flexion and extension, otherwise relatively normal ROM of hips, no other leg tenderness, swelling or  abnormality Lets neurovascular intact Arms nontender , normal ROM, no deformity of shoulder Arms neurovascularly intact Pulses: 2+ radial pulses, 2+ pedal pulses, normal cap refill Ext: no edema  EKG Indication chest discomfort, rate 65 bpm, PR 160 ms, QRS 94 ms, QTC 397 ms, 34 degree axis,    Assessment: Encounter Diagnoses  Name Primary?  . Chest pain, unspecified type Yes  . Right hip pain   . Hip asymmetry   . Chronic nonintractable headache, unspecified headache type   . Neck pain   . Side pain      Plan: Chest pain-EKG reviewed.  No worrisome findings.   I suspect his pain is more related to either radicular neck  issue or less likely cardiac.  Discussed gerd triggers, stress, he was tender over pectoralis muscle at the origin.   He will return soon for physical, labs.  In the meantime, about GERD triggers, reduce stress, use stretching as discussed.   Neck pain-we will send for baseline x-ray given the headaches and chronic pain with the neck.  Also to assess whether there is a concern that this may be causing the left arm and chest pain  Right hip pain popping and asymmetry-we will send for x-ray of bilateral hips  Chronic headaches-discussed preventative measures, keeping headache diary, working on stretching and posture, limit over-the-counter analgesics particular NSAIDs, plan to recheck in a few weeks  Side pain nonspecific-no obvious indication for imaging.  Follow-up in a few weeks if not improving   Lean was seen today for new patient (initial visit).  Diagnoses and all orders for this visit:  Chest pain, unspecified type -     EKG 12-Lead  Right hip pain -     DG HIPS BILAT WITH PELVIS 3-4 VIEWS; Future  Hip asymmetry -     DG HIPS BILAT WITH PELVIS 3-4 VIEWS; Future  Chronic nonintractable headache, unspecified headache type -     DG Cervical Spine Complete; Future  Neck pain -     DG Cervical Spine Complete; Future  Side pain    Follow up: pending xrays, return soon for fasting physical

## 2020-10-24 NOTE — Patient Instructions (Signed)
Please go to [facility] for your neck and hip xrays.   Their hours are 8am - 4:30 pm Monday - Friday.  Take your insurance card with you.  [facility] [phone]  [address], [address] [place], [place] [address]  [address] [place], [place] [address]

## 2020-10-26 ENCOUNTER — Other Ambulatory Visit: Payer: Self-pay

## 2020-10-26 DIAGNOSIS — M25551 Pain in right hip: Secondary | ICD-10-CM

## 2020-10-26 DIAGNOSIS — M542 Cervicalgia: Secondary | ICD-10-CM

## 2020-12-22 DIAGNOSIS — R519 Headache, unspecified: Secondary | ICD-10-CM | POA: Diagnosis not present

## 2020-12-23 ENCOUNTER — Emergency Department (HOSPITAL_COMMUNITY)
Admission: EM | Admit: 2020-12-23 | Discharge: 2020-12-23 | Disposition: A | Discharge status: Home / Self Care | Payer: BC Managed Care – PPO | Attending: Emergency Medicine | Admitting: Emergency Medicine

## 2020-12-23 ENCOUNTER — Other Ambulatory Visit: Payer: Self-pay

## 2020-12-23 ENCOUNTER — Emergency Department (HOSPITAL_COMMUNITY): Payer: BC Managed Care – PPO

## 2020-12-23 ENCOUNTER — Encounter (HOSPITAL_COMMUNITY): Payer: Self-pay

## 2020-12-23 DIAGNOSIS — J019 Acute sinusitis, unspecified: Secondary | ICD-10-CM | POA: Diagnosis not present

## 2020-12-23 DIAGNOSIS — R519 Headache, unspecified: Secondary | ICD-10-CM | POA: Insufficient documentation

## 2020-12-23 DIAGNOSIS — R531 Weakness: Secondary | ICD-10-CM | POA: Insufficient documentation

## 2020-12-23 DIAGNOSIS — Z87891 Personal history of nicotine dependence: Secondary | ICD-10-CM | POA: Insufficient documentation

## 2020-12-23 LAB — CBC
HCT: 44.5 % (ref 39.0–52.0)
Hemoglobin: 15 g/dL (ref 13.0–17.0)
MCH: 29.4 pg (ref 26.0–34.0)
MCHC: 33.7 g/dL (ref 30.0–36.0)
MCV: 87.3 fL (ref 80.0–100.0)
Platelets: 260 10*3/uL (ref 150–400)
RBC: 5.1 MIL/uL (ref 4.22–5.81)
RDW: 12.6 % (ref 11.5–15.5)
WBC: 6.3 10*3/uL (ref 4.0–10.5)
nRBC: 0 % (ref 0.0–0.2)

## 2020-12-23 LAB — BASIC METABOLIC PANEL
Anion gap: 8 (ref 5–15)
BUN: 11 mg/dL (ref 6–20)
CO2: 26 mmol/L (ref 22–32)
Calcium: 9.4 mg/dL (ref 8.9–10.3)
Chloride: 106 mmol/L (ref 98–111)
Creatinine, Ser: 0.97 mg/dL (ref 0.61–1.24)
GFR, Estimated: >60 mL/min (ref >60)
Glucose, Bld: 102 mg/dL — ABNORMAL HIGH (ref 70–99)
Potassium: 4.1 mmol/L (ref 3.5–5.1)
Sodium: 140 mmol/L (ref 135–145)

## 2020-12-23 IMAGING — MR MR HEAD W/O CM
11 of 13 series · 33 of 48 positions shown · non-contrast
Comparison: None.

CLINICAL DATA: 34-year-old male with severe headaches, light
sensitivity. No known injury.

EXAM:
MRI HEAD WITHOUT CONTRAST
MRA HEAD WITHOUT CONTRAST
TECHNIQUE: Multiplanar, multiecho pulse sequences of the brain and surrounding
structures were obtained without intravenous contrast. Angiographic
images of the head were obtained using MRA technique without
contrast.

[Series 5: DWI · axial · 3.0mm · 0.88mm/px · z∈[-112,+27]mm · 6 of 96 slices shown (1 of 4)]
[im 1/96]
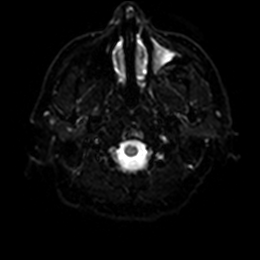
[im 20/96]
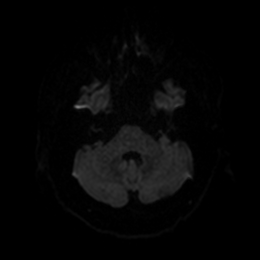
[im 39/96]
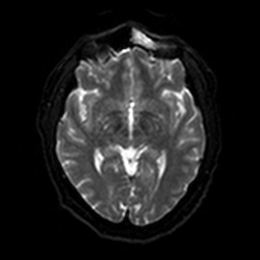
[im 58/96]
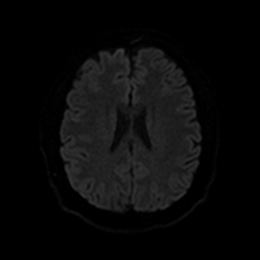
[im 77/96]
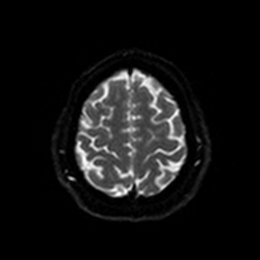
[im 96/96]
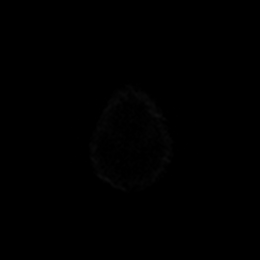

[Series 6: DWI · axial · 3.0mm · 0.88mm/px · z∈[-112,+27]mm · 3 of 48 slices shown (2 of 4)]
[im 1/48]
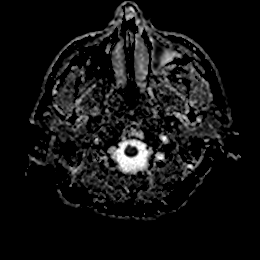
[im 24/48]
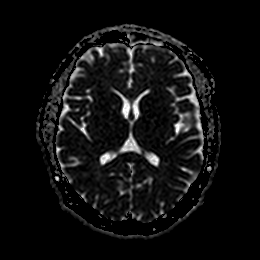
[im 48/48]
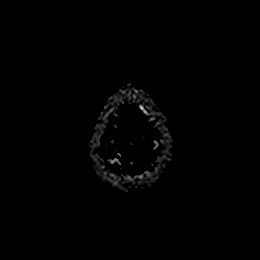

[Series 7: DWI · coronal · 4.0mm · 0.88mm/px · 4 of 64 slices shown (3 of 4)]
[im 1/64]
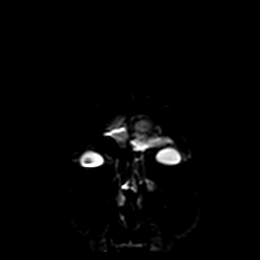
[im 22/64]
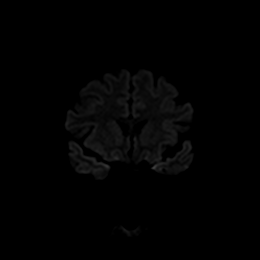
[im 43/64]
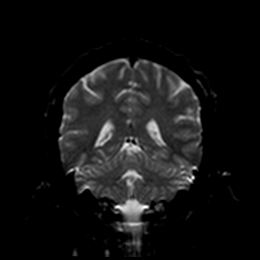
[im 64/64]
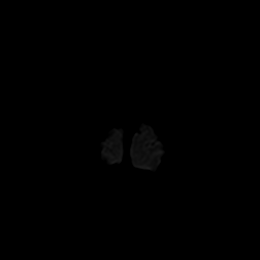

[Series 8: DWI · coronal · 4.0mm · 0.88mm/px · 2 of 32 slices shown (4 of 4)]
[im 1/32]
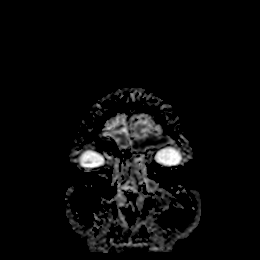
[im 32/32]
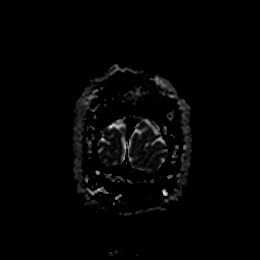

[Series 13: T1 · sagittal · 5.0mm · 0.75mm/px · 1 of 23 slices shown]
[im 1/23]
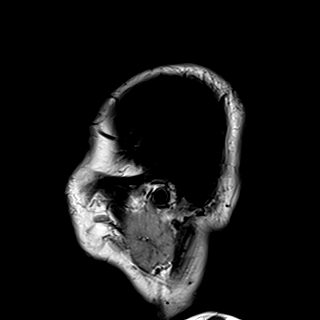

[Series 14: T2 · axial · 5.0mm · 0.72mm/px · z∈[-114,+29]mm · 2 of 25 slices shown (1 of 2)]
[im 1/25]
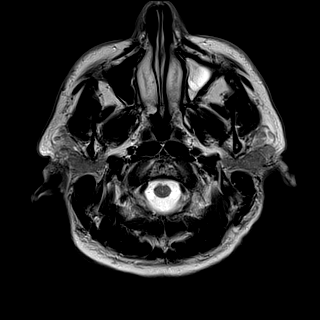
[im 25/25]
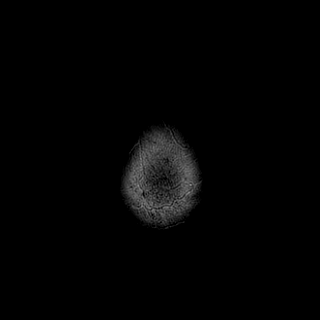

[Series 15: FLAIR · axial · 5.0mm · 0.45mm/px · z∈[-112,+31]mm · 2 of 25 slices shown]
[im 1/25]
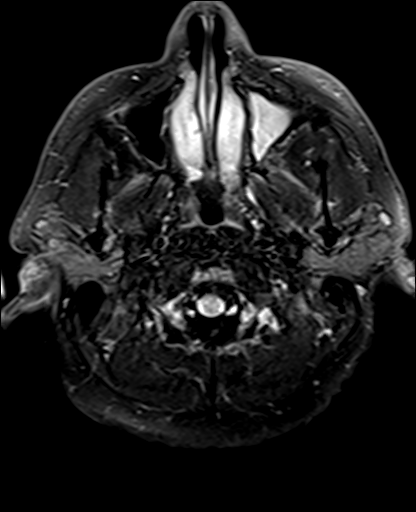
[im 25/25]
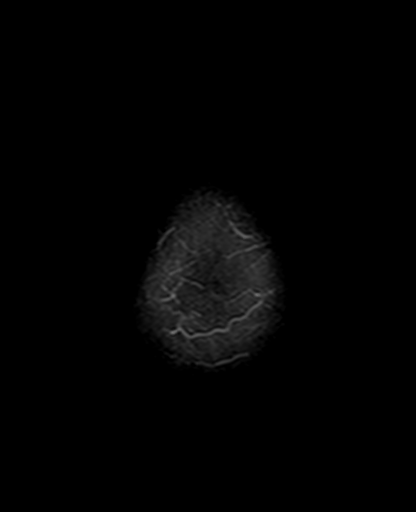

[Series 16: mag_images · axial · 3.0mm · 0.90mm/px · z∈[-128,+47]mm · 4 of 60 slices shown]
[im 1/60]
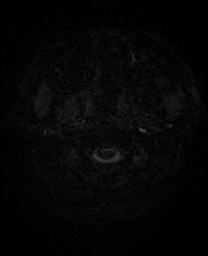
[im 20/60]
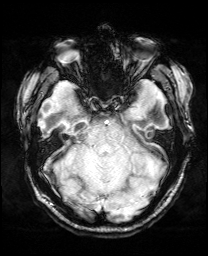
[im 40/60]
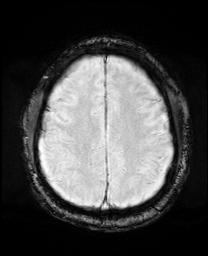
[im 60/60]
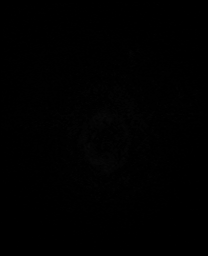

[Series 18: swi_images · axial · 3.0mm · 0.90mm/px · z∈[-128,+47]mm · 4 of 60 slices shown]
[im 1/60]
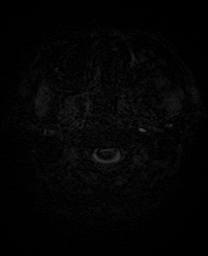
[im 20/60]
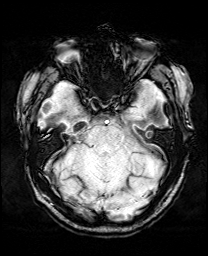
[im 40/60]
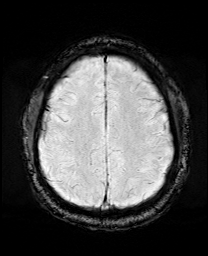
[im 60/60]
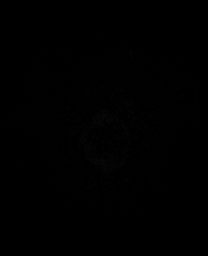

[Series 19: mip_images(sw) · axial · 24.0mm · 0.90mm/px · z∈[-118,+37]mm · 3 of 53 slices shown]
[im 1/53]
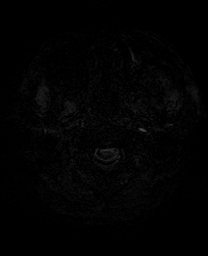
[im 27/53]
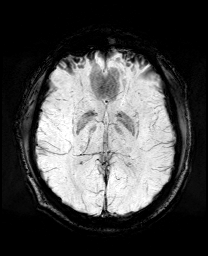
[im 53/53]
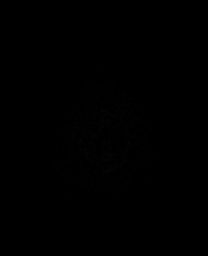

[Series 21: T2 · coronal · 5.0mm · 0.34mm/px · 2 of 29 slices shown (2 of 2)]
[im 1/29]
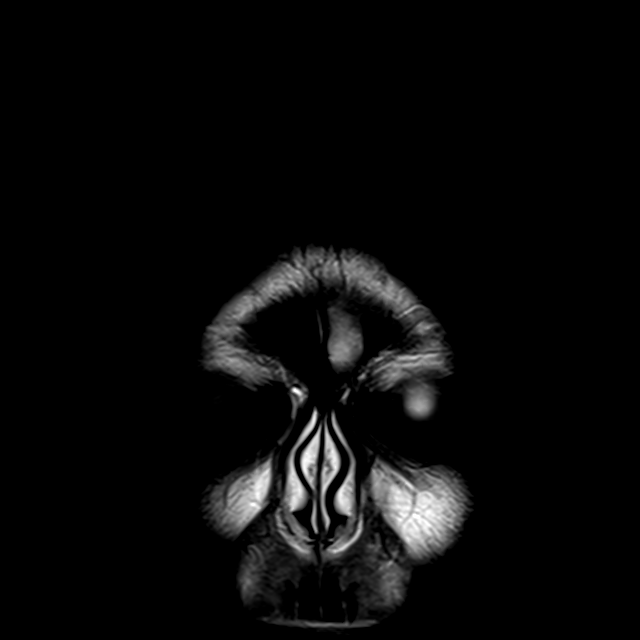
[im 29/29]
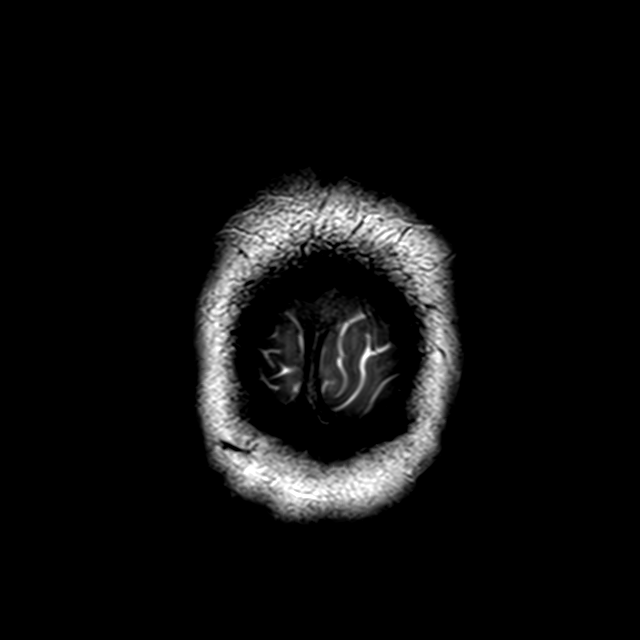

[33 of 48 positions shown; findings below may reference images not displayed]

FINDINGS: MRI HEAD FINDINGS

Brain: Cerebral volume is within normal limits. No restricted
diffusion to suggest acute infarction. No midline shift, mass
effect, evidence of mass lesion, ventriculomegaly, extra-axial
collection or acute intracranial hemorrhage. Cervicomedullary
junction and pituitary are within normal limits. Gray and white
matter signal is within normal limits throughout the brain. No
encephalomalacia or chronic cerebral blood products identified. Deep
gray matter nuclei, brainstem and cerebellum appear negative.

Vascular: Major intracranial vascular flow voids are preserved.

Skull and upper cervical spine: Negative. Visualized bone marrow
signal is within normal limits.

Sinuses/Orbits: Opacified left frontal, ethmoid and maxillary
sinuses with inspissated material. Left sphenoid sinus is spared.
There is trace or mild scattered right ethmoid and maxillary sinus
mucosal thickening.

Negative orbits soft tissues.

Other: Mastoids are clear. Grossly normal visible internal auditory
structures. Normal stylomastoid foramina.

There is a small triangular 12 mm area of posterior midline probable
scalp soft tissue scarring on series 13, image 11. Normal underlying
bone marrow signal.

MRA HEAD FINDINGS

Antegrade flow in the posterior circulation with codominant
appearing distal vertebral arteries. Normal right PICA origin is
included. Normal vertebrobasilar junction and basilar artery. Normal
SCA and PCA origins. Posterior communicating arteries are diminutive
or absent. Bilateral PCA branches are within normal limits.

Antegrade flow in both ICA siphons. No siphon stenosis. Normal
ophthalmic artery origins. Patent carotid termini. Normal MCA and
ACA origins. Normal anterior communicating artery. Visible ACA
branches are within normal limits. Bilateral MCA M1 segments and MCA
trifurcations are patent without stenosis. Visible bilateral MCA
branches are within normal limits.
IMPRESSION: 1.  Normal noncontrast MRI appearance of the brain.
2. Normal intracranial MRA.
3. OMC obstructive pattern of Left paranasal sinus disease.

## 2020-12-23 IMAGING — MR MR MRA HEAD W/O CM
1 series · 18 of 48 positions shown · non-contrast
Comparison: None.

CLINICAL DATA: 34-year-old male with severe headaches, light
sensitivity. No known injury.

EXAM:
MRI HEAD WITHOUT CONTRAST
MRA HEAD WITHOUT CONTRAST
TECHNIQUE: Multiplanar, multiecho pulse sequences of the brain and surrounding
structures were obtained without intravenous contrast. Angiographic
images of the head were obtained using MRA technique without
contrast.

[Series 9: 3d cow · axial · 0.5mm · 0.41mm/px · z∈[-109,-29]mm · 18 of 172 slices shown]
[im 1/172]
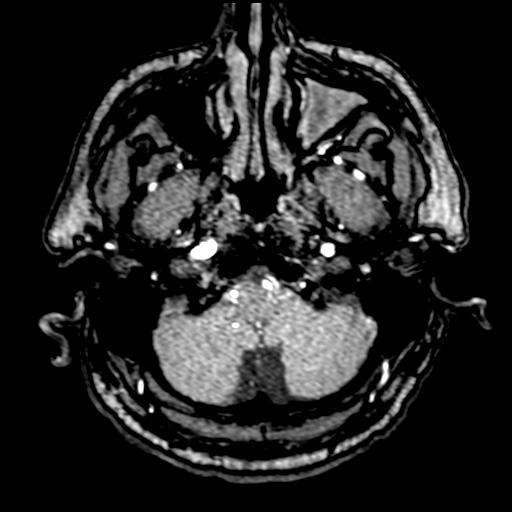
[im 4/172]
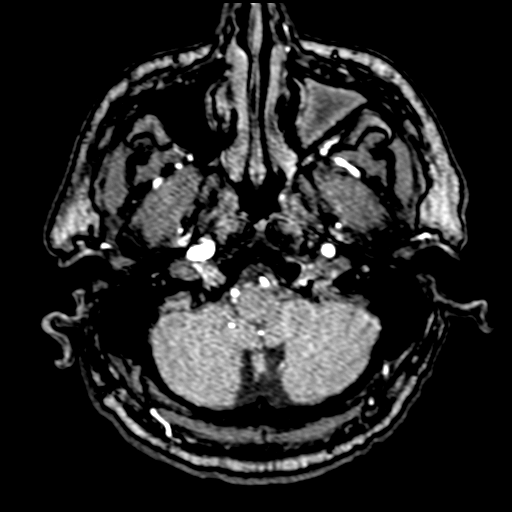
[im 8/172]
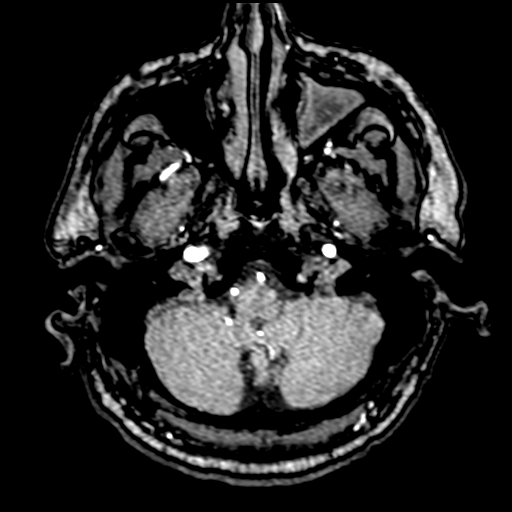
[im 11/172]
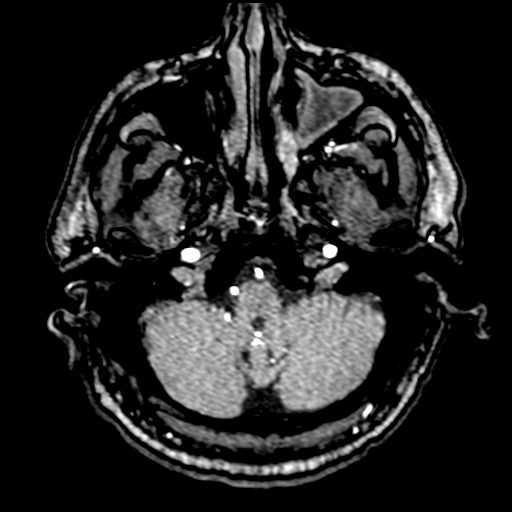
[im 15/172]
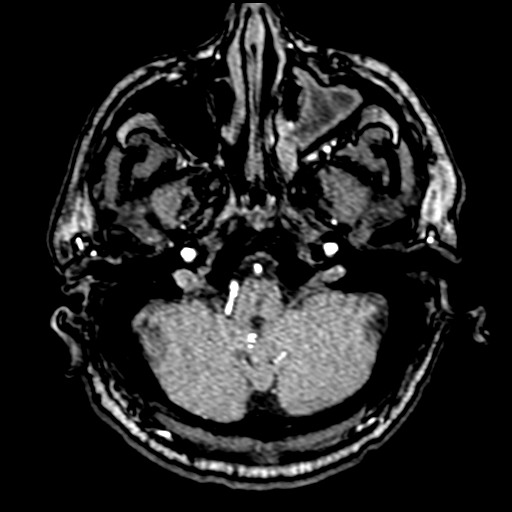
[im 19/172]
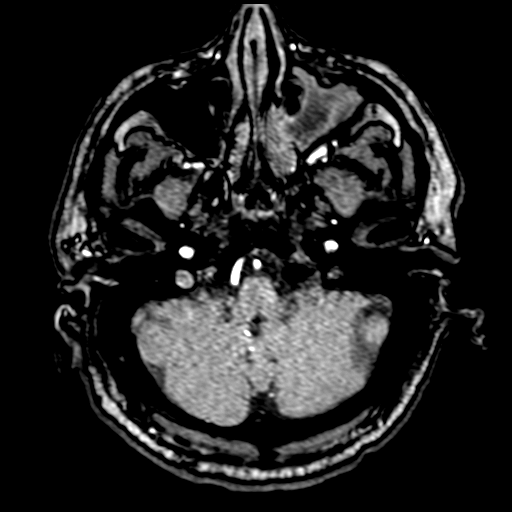
[im 22/172]
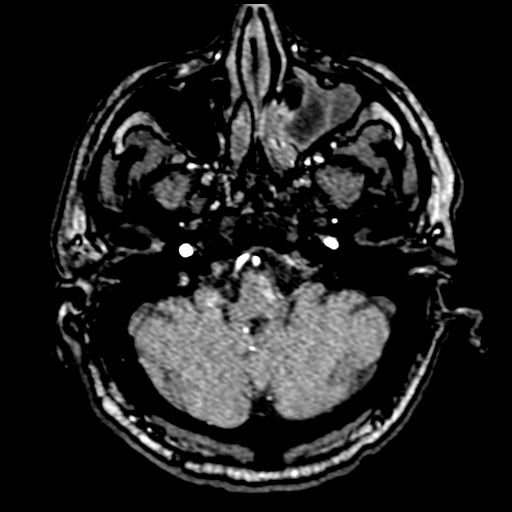
[im 26/172]
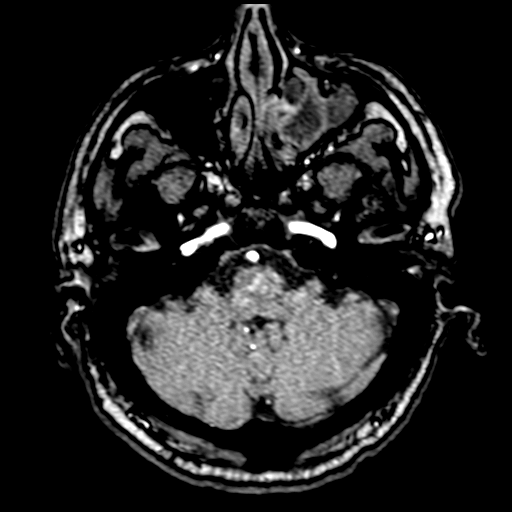
[im 30/172]
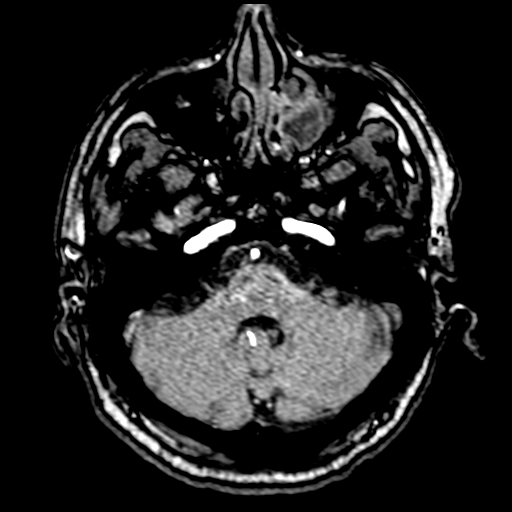
[im 33/172]
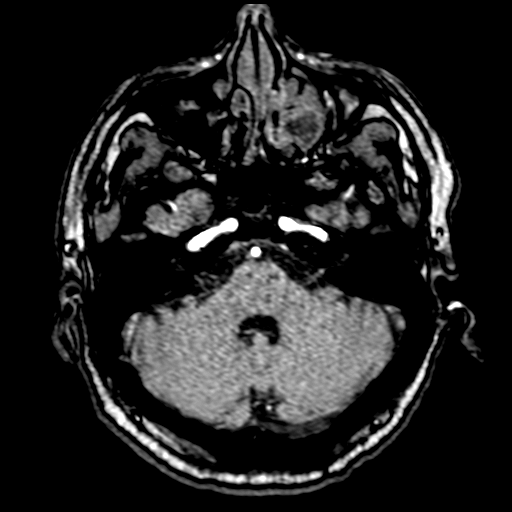
[im 55/172]
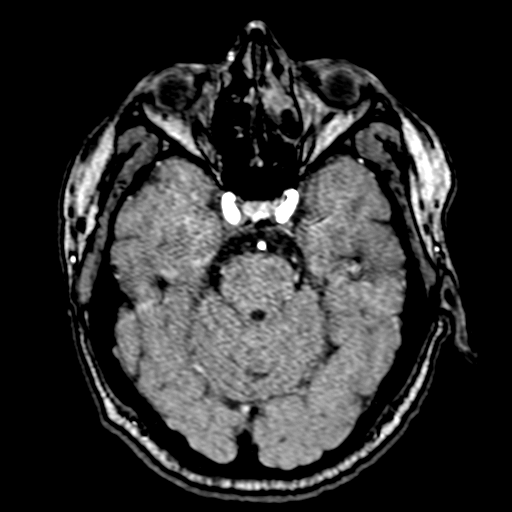
[im 77/172]
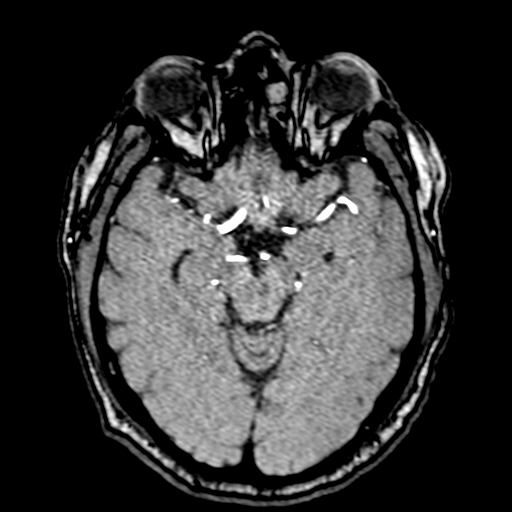
[im 88/172]
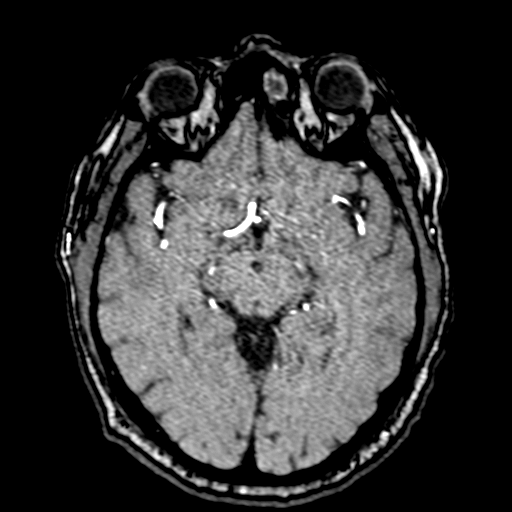
[im 99/172]
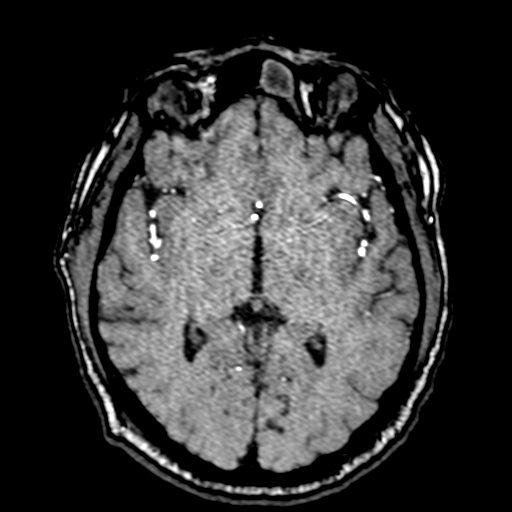
[im 121/172]
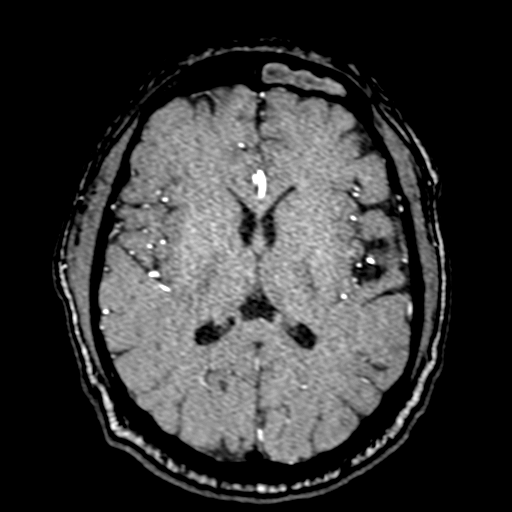
[im 142/172]
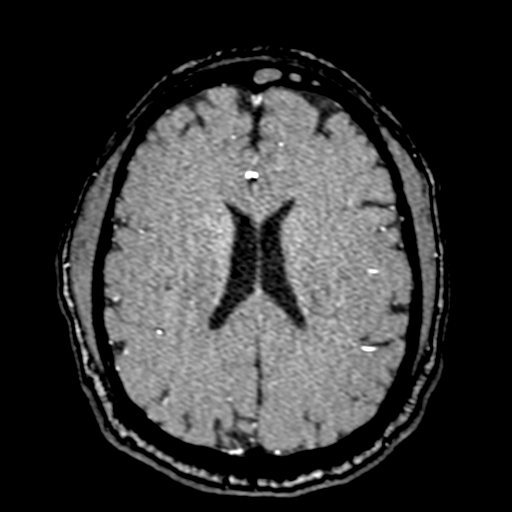
[im 146/172]
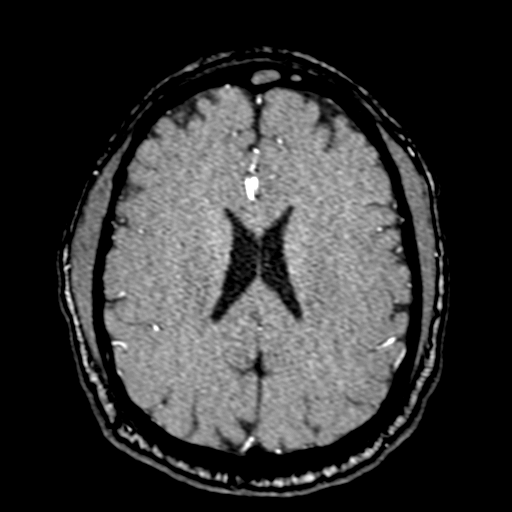
[im 164/172]
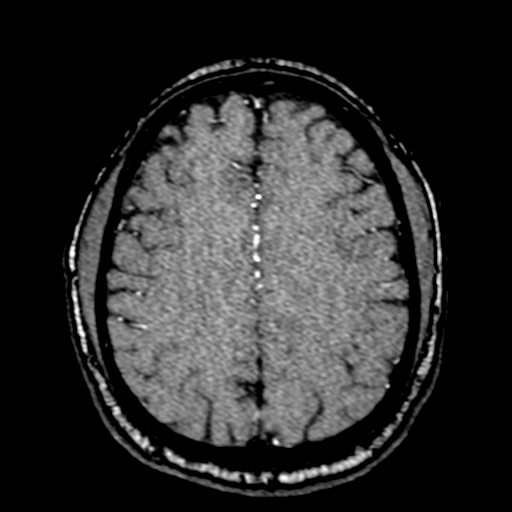

[18 of 48 positions shown; findings below may reference images not displayed]

FINDINGS: MRI HEAD FINDINGS

Brain: Cerebral volume is within normal limits. No restricted
diffusion to suggest acute infarction. No midline shift, mass
effect, evidence of mass lesion, ventriculomegaly, extra-axial
collection or acute intracranial hemorrhage. Cervicomedullary
junction and pituitary are within normal limits. Gray and white
matter signal is within normal limits throughout the brain. No
encephalomalacia or chronic cerebral blood products identified. Deep
gray matter nuclei, brainstem and cerebellum appear negative.

Vascular: Major intracranial vascular flow voids are preserved.

Skull and upper cervical spine: Negative. Visualized bone marrow
signal is within normal limits.

Sinuses/Orbits: Opacified left frontal, ethmoid and maxillary
sinuses with inspissated material. Left sphenoid sinus is spared.
There is trace or mild scattered right ethmoid and maxillary sinus
mucosal thickening.

Negative orbits soft tissues.

Other: Mastoids are clear. Grossly normal visible internal auditory
structures. Normal stylomastoid foramina.

There is a small triangular 12 mm area of posterior midline probable
scalp soft tissue scarring on series 13, image 11. Normal underlying
bone marrow signal.

MRA HEAD FINDINGS

Antegrade flow in the posterior circulation with codominant
appearing distal vertebral arteries. Normal right PICA origin is
included. Normal vertebrobasilar junction and basilar artery. Normal
SCA and PCA origins. Posterior communicating arteries are diminutive
or absent. Bilateral PCA branches are within normal limits.

Antegrade flow in both ICA siphons. No siphon stenosis. Normal
ophthalmic artery origins. Patent carotid termini. Normal MCA and
ACA origins. Normal anterior communicating artery. Visible ACA
branches are within normal limits. Bilateral MCA M1 segments and MCA
trifurcations are patent without stenosis. Visible bilateral MCA
branches are within normal limits.
IMPRESSION: 1.  Normal noncontrast MRI appearance of the brain.
2. Normal intracranial MRA.
3. OMC obstructive pattern of Left paranasal sinus disease.

## 2020-12-23 MED ORDER — METOCLOPRAMIDE HCL 5 MG/ML IJ SOLN
10.0000 mg | Freq: Once | INTRAMUSCULAR | Status: AC
  Administered 2020-12-23: 10 mg via INTRAVENOUS
  Filled 2020-12-23: qty 2

## 2020-12-23 MED ORDER — DIPHENHYDRAMINE HCL 50 MG/ML IJ SOLN
12.5000 mg | Freq: Once | INTRAMUSCULAR | Status: AC
  Administered 2020-12-23: 12.5 mg via INTRAVENOUS
  Filled 2020-12-23: qty 1

## 2020-12-23 MED ORDER — KETOROLAC TROMETHAMINE 15 MG/ML IJ SOLN
15.0000 mg | Freq: Once | INTRAMUSCULAR | Status: AC
  Administered 2020-12-23: 15 mg via INTRAVENOUS
  Filled 2020-12-23: qty 1

## 2020-12-23 NOTE — Discharge Instructions (Signed)

## 2020-12-23 NOTE — ED Triage Notes (Signed)
Per PT: Headache onset Tuesday, mostly right side and back of head. OTC meds no relief.  Urgent care visit yesterday, no help. Left face and left arm numbness onset early this morning.

## 2020-12-23 NOTE — ED Provider Notes (Signed)
[facility] EMERGENCY DEPARTMENT Provider Note   CSN: [id number] Arrival date & time: 12/23/20  0759     History Chief Complaint  Patient presents with  . Headache  . Numbness    Daniel Wang is a 35 y.o. male.who speaks fluent english (as opposed to [employer]) who presents the emergency department for headache and weakness.  Patient states that he has had headaches on and off for several weeks.  He has had about 5 days of persistent headache.  His headaches normally improved with Tylenol.  He was seen at an urgent care yesterday and felt to be either having tension or migraine type headache was treated with IM Toradol and Tylenol and discharged.  He had no relief of symptoms.  Patient states that he went to bed normal last night around 10 PM and when he awoke this morning felt like his left arm and leg were weak.  The patient also acknowledges blurry vision.  He states that his headache is generalized, pressure-like and he has some associated blurred vision.  He wears glasses.  The patient is a [employer] and states that he stares at screens all day.  He states that he has had "migraine" headaches in the past but has never had these symptoms before.  HPI     Past Medical History:  Diagnosis Date  . GERD (gastroesophageal reflux disease)   . Headache    chronic as of 10/2020  . History of kidney stones   . Wears glasses     Patient Active Problem List   Diagnosis Date Noted  . Chest pain 10/24/2020  . Right hip pain 10/24/2020  . Hip asymmetry 10/24/2020  . Chronic nonintractable headache 10/24/2020  . Neck pain 10/24/2020  . Side pain 10/24/2020    Past Surgical History:  Procedure Laterality Date  . CYSTOSCOPY/URETEROSCOPY/HOLMIUM LASER Right 05/22/2017   Procedure: CYSTOSCOPY/RIGHT URETEROSCOPY/RIGHT RETROGRADE PYELOGRAM WITH STENT PLACEMENT;  Surgeon: Crista Elliot, MD;  Location: [facility];  Service: Urology;  Laterality: Right;        Family History  Problem Relation Age of Onset  . Hypertension Mother   . Hyperlipidemia Mother   . Thyroid disease Mother     Social History   Tobacco Use  . Smoking status: Former Smoker    Quit date: 2003    Years since quitting: 19.3  . Smokeless tobacco: Never Used  Vaping Use  . Vaping Use: Never used  Substance Use Topics  . Alcohol use: Yes    Comment: special occasions   . Drug use: No    Home Medications Prior to Admission medications   Not on File    Allergies    Patient has no known allergies.  Review of Systems   Review of Systems Ten systems reviewed and are negative for acute change, except as noted in the HPI.   Physical Exam Updated Vital Signs BP 117/88 (BP Location: Right Arm)   Pulse 73   Temp 97.9 F (36.6 C) (Oral)   Resp 16   SpO2 99%   Physical Exam Vitals and nursing note reviewed.  Constitutional:      General: He is not in acute distress.    Appearance: He is well-developed. He is not diaphoretic.  HENT:     Head: Normocephalic and atraumatic.  Eyes:     General: No visual field deficit or scleral icterus.    Extraocular Movements: Extraocular movements intact.     Conjunctiva/sclera: Conjunctivae normal.  Pupils: Pupils are equal, round, and reactive to light.     Comments: No horizontal, vertical or rotational nystagmus  Neck:     Meningeal: Brudzinski's sign and Kernig's sign absent.     Comments: Full active and passive ROM without pain No midline or paraspinal tenderness No nuchal rigidity or meningeal signs Cardiovascular:     Rate and Rhythm: Normal rate and regular rhythm.     Heart sounds: Normal heart sounds.  Pulmonary:     Effort: Pulmonary effort is normal. No respiratory distress.     Breath sounds: Normal breath sounds. No wheezing or rales.  Abdominal:     General: Bowel sounds are normal.     Palpations: Abdomen is soft.     Tenderness: There is no abdominal tenderness. There is no guarding  or rebound.  Musculoskeletal:        General: Normal range of motion.     Cervical back: Normal range of motion and neck supple. No rigidity.  Lymphadenopathy:     Cervical: No cervical adenopathy.  Skin:    General: Skin is warm and dry.     Findings: No rash.  Neurological:     Mental Status: He is alert and oriented to person, place, and time.     Cranial Nerves: No cranial nerve deficit, dysarthria or facial asymmetry.     Sensory: Sensory deficit present.     Motor: No weakness or abnormal muscle tone.     Coordination: Coordination normal.     Deep Tendon Reflexes: Reflexes normal.     Comments: Mental Status:  Alert, oriented, thought content appropriate. Speech fluent without evidence of aphasia. Able to follow 2 step commands without difficulty.  Cranial Nerves:  II:  Peripheral visual fields grossly normal, pupils equal, round, reactive to light III,IV, VI: ptosis not present, extra-ocular motions intact bilaterally  V,VII: smile symmetric, facial light touch sensation equal VIII: hearing grossly normal bilaterally  IX,X: midline uvula rise  XI: bilateral shoulder shrug equal and strong XII: midline tongue extension  Motor:  5/5 in upper and lower extremities bilaterally including strong and equal grip strength and dorsiflexion/plantar flexion Sensory: Decreased sensation in the left arm and leg globally Cerebellar: normal finger-to-nose with bilateral upper extremities Gait: Deferred CV: distal pulses palpable throughout   Psychiatric:        Mood and Affect: Mood is anxious.        Behavior: Behavior normal.        Thought Content: Thought content normal.        Judgment: Judgment normal.     ED Results / Procedures / Treatments   Labs (all labs ordered are listed, but only abnormal results are displayed) Labs Reviewed - No data to display  EKG None  Radiology No results found.  Procedures Procedures   Medications Ordered in ED Medications - No data  to display  ED Course  I have reviewed the triage vital signs and the nursing notes.  Pertinent labs & imaging results that were available during my care of the patient were reviewed by me and considered in my medical decision making (see chart for details).  Clinical Course as of 12/23/20 0847  Wynelle Link Dec 23, 2020  7322 I was called urgently to the room by the patient's nurse for positive NIH.  She lifted the patient's arm and he immediately dropped it to the bed as if it were paralyzed.  The patient was sitting upright with shoulders pulled up to his ears  and his arms in rigid extension bilaterally.  With my encouragement he was very easily able to hold both his arm and left leg up without paralysis.  He did not have any objective weakness on my examination. [AH]  0845 Dr. Stevie Kern discussed case with Dr. Selina Cooley of neurology who recommends MRI/MRA imaging.  We will treat for assumed migraine [AH]    Clinical Course User Index [AH] Arthor Captain, PA-C   Daniel Wang                           Patient's HA improved after treatment.  And interpreted the MRI/MRA images.  There are no acute findings on these images. Patient's abnormal neuro symptoms have improved .Daniel Wang presents with headache Given the large differential diagnosis for Daniel Wang, the decision making in this case is of high complexity.  After evaluating all of the data points in this case, the presentation of Daniel Wang is NOT consistent with skull fracture, meningitis/encephalitis, SAH/sentinel bleed, Intracranial Hemorrhage (ICH) (subdural/epidural), acute obstructive hydrocephalus, space occupying lesions, CVA, CO Poisoning, Basilar/vertebral artery dissection, preeclampsia, cerebral venous thrombosis, hypertensive emergency, temporal Arteritis, Idiopathic Intracranial Hypertension (pseudotumor cerebri).  Strict return and follow-up precautions have been given by me personally  or by detailed written instructions verbalized by nursing staff using the teach back method to patient/family/caregiver.  Data Reviewed/Counseling: I have reviewed the patient's vital signs, nursing notes, and other relevant tests/information. I had a detailed discussion regarding the historical points, exam findings, and any diagnostic results supporting the discharge diagnosis. I also discussed the need for outpatient follow-up and the need to return to the ED if symptoms worsen or if there are any questions or concerns that arise at [facility]   Final Clinical Impression(s) / ED Diagnoses Final diagnoses:  Bad headache    Rx / DC Orders ED Discharge Orders    None       Arthor Captain, PA-C 12/23/20 1045    Milagros Loll, MD 12/24/20 [phone]

## 2021-01-17 ENCOUNTER — Encounter: Payer: BC Managed Care – PPO | Admitting: Medical

## 2021-03-08 ENCOUNTER — Encounter: Payer: BC Managed Care – PPO | Admitting: Medical

## 2021-03-20 ENCOUNTER — Ambulatory Visit: Payer: BC Managed Care – PPO | Admitting: Neurology

## 2021-03-21 ENCOUNTER — Ambulatory Visit: Payer: BC Managed Care – PPO | Admitting: Neurology

## 2021-03-21 ENCOUNTER — Encounter: Payer: Self-pay | Admitting: Neurology

## 2021-04-02 ENCOUNTER — Ambulatory Visit (INDEPENDENT_AMBULATORY_CARE_PROVIDER_SITE_OTHER): Payer: BC Managed Care – PPO | Admitting: Medical

## 2021-04-02 ENCOUNTER — Encounter: Payer: Self-pay | Admitting: Medical

## 2021-04-02 ENCOUNTER — Other Ambulatory Visit: Payer: Self-pay

## 2021-04-02 VITALS — BP 118/80 | HR 83 | Temp 98.6°F | Ht 64.5 in | Wt 170.8 lb

## 2021-04-02 DIAGNOSIS — Z131 Encounter for screening for diabetes mellitus: Secondary | ICD-10-CM | POA: Diagnosis not present

## 2021-04-02 DIAGNOSIS — Z1329 Encounter for screening for other suspected endocrine disorder: Secondary | ICD-10-CM | POA: Insufficient documentation

## 2021-04-02 DIAGNOSIS — Z7185 Encounter for immunization safety counseling: Secondary | ICD-10-CM | POA: Diagnosis not present

## 2021-04-02 DIAGNOSIS — Z0000 Encounter for general adult medical examination without abnormal findings: Secondary | ICD-10-CM

## 2021-04-02 DIAGNOSIS — R0683 Snoring: Secondary | ICD-10-CM | POA: Insufficient documentation

## 2021-04-02 DIAGNOSIS — Z23 Encounter for immunization: Secondary | ICD-10-CM | POA: Diagnosis not present

## 2021-04-02 DIAGNOSIS — Z13220 Encounter for screening for lipoid disorders: Secondary | ICD-10-CM

## 2021-04-02 DIAGNOSIS — R519 Headache, unspecified: Secondary | ICD-10-CM

## 2021-04-02 DIAGNOSIS — G8929 Other chronic pain: Secondary | ICD-10-CM

## 2021-04-02 MED ORDER — AMOXICILLIN-POT CLAVULANATE 875-125 MG PO TABS: 1.0000 | ORAL_TABLET | Freq: Two times a day (BID) | ORAL | 0 refills | Status: DC

## 2021-04-02 MED ORDER — SUMATRIPTAN SUCCINATE 50 MG PO TABS: 50.0000 mg | ORAL_TABLET | ORAL | 0 refills | Status: DC | PRN

## 2021-04-02 NOTE — Progress Notes (Signed)
Subjective:   HPI  Daniel Wang is a 35 y.o. male who presents for Chief Complaint  Patient presents with   Annual Exam    CPE no other issues did bring up at ED visit back in 2023-01-13 notes should be in the system.     Patient Care Team: Aerial Dilley, Cleda Mccreedy as PCP - General (Family Medicine) Sees dentist Sees eye doctor   Concerns: After his last visit here he ended up being seen in the emergency department for really bad migraine.  Had some treatment.  He is not getting migraines very but does get them every now and then.  He also does have some sinus pressure every now and then.  He has been under some stress lately.  His brother passed away in a car wreck in [place] in 2023/01/13 and his aunt passed away the month later  He does endorse snoring.  He does endorse daytime somnolence sometimes and nonrestful awakening.  His wife is commented about snoring  Reviewed their medical, surgical, family, social, medication, and allergy history and updated chart as appropriate.  Past Medical History:  Diagnosis Date   GERD (gastroesophageal reflux disease)    Headache    chronic as of 10/2020   History of kidney stones    Wears glasses     Past Surgical History:  Procedure Laterality Date   CYSTOSCOPY/URETEROSCOPY/HOLMIUM LASER Right 05/22/2017   Procedure: CYSTOSCOPY/RIGHT URETEROSCOPY/RIGHT RETROGRADE PYELOGRAM WITH STENT PLACEMENT;  Surgeon: Crista Elliot, MD;  Location: [facility];  Service: Urology;  Laterality: Right;    Family History  Problem Relation Age of Onset   Hypertension Mother    Hyperlipidemia Mother    Thyroid disease Mother    Diabetes Sister    Other Brother        died 2020/12/13 in MVA   Other Brother        congenital   Other Maternal Aunt      Current Outpatient Medications:    amoxicillin-clavulanate (AUGMENTIN) 875-125 MG tablet, Take 1 tablet by mouth 2 (two) times daily., Disp: 20 tablet, Rfl: 0   SUMAtriptan (IMITREX) 50 MG tablet, Take 1 tablet  (50 mg total) by mouth every 2 (two) hours as needed for migraine. May repeat in 2 hours if headache persists or recurs., Disp: 10 tablet, Rfl: 0  No Known Allergies   Review of Systems Constitutional: -fever, -chills, -sweats, -unexpected weight change, -decreased appetite, -fatigue Allergy: -sneezing, -itching, -congestion Dermatology: -changing moles, --rash, -lumps ENT: -runny nose, -ear pain, -sore throat, -hoarseness, -sinus pain, -teeth pain, - ringing in ears, -hearing loss, -nosebleeds Cardiology: -chest pain, -palpitations, -swelling, -difficulty breathing when lying flat, -waking up short of breath Respiratory: -cough, -shortness of breath, -difficulty breathing with exercise or exertion, -wheezing, -coughing up blood Gastroenterology: -abdominal pain, -nausea, -vomiting, -diarrhea, -constipation, -blood in stool, -changes in bowel movement, -difficulty swallowing or eating Hematology: -bleeding, -bruising  Musculoskeletal: -joint aches, -muscle aches, -joint swelling, -back pain, -neck pain, -cramping, -changes in gait Ophthalmology: denies vision changes, eye redness, itching, discharge Urology: -burning with urination, -difficulty urinating, -blood in urine, -urinary frequency, -urgency, -incontinence Neurology: +headache, -weakness, -tingling, -numbness, -memory loss, -falls, -dizziness Psychology: -depressed mood, -agitation, -sleep problems Male GU: no testicular mass, pain, no lymph nodes swollen, no swelling, no rash.  Depression screen [facility] 2/9 04/02/2021 10/24/2020 11/16/2015  Decreased Interest 0 0 0  Down, Depressed, Hopeless 0 0 0  PHQ - 2 Score 0 0 0  Objective:  BP 118/80   Pulse 83   Temp 98.6 F (37 C)   Ht 5' 4.5" (1.638 m)   Wt 170 lb 12.8 oz (77.5 kg)   BMI 28.86 kg/m   BP Readings from Last 3 Encounters:  04/02/21 118/80  12/23/20 111/74  10/24/20 112/80   Wt Readings from Last 3 Encounters:  04/02/21 170 lb 12.8 oz (77.5 kg)  10/24/20 166  lb 9.6 oz (75.6 kg)  05/22/17 162 lb (73.5 kg)    General appearance: alert, no distress, WD/WN, Hispanic male Skin: Unremarkable HEENT: normocephalic, conjunctiva/corneas normal, sclerae anicteric, PERRLA, EOMi, nares patent, no discharge or erythema, pharynx normal Oral cavity: MMM, tongue normal, teeth normal Neck: supple, no lymphadenopathy, no thyromegaly, no masses, normal ROM, no bruits Chest: non tender, normal shape and expansion Heart: RRR, normal S1, S2, no murmurs Lungs: CTA bilaterally, no wheezes, rhonchi, or rales Abdomen: +bs, soft, non tender, non distended, no masses, no hepatomegaly, no splenomegaly, no bruits Back: non tender, normal ROM, no scoliosis Musculoskeletal: upper extremities non tender, no obvious deformity, normal ROM throughout, lower extremities non tender, no obvious deformity, normal ROM throughout Extremities: no edema, no cyanosis, no clubbing Pulses: 2+ symmetric, upper and lower extremities, normal cap refill Neurological: alert, oriented x 3, CN2-12 intact, strength normal upper extremities and lower extremities, sensation normal throughout, DTRs 2+ throughout, no cerebellar signs, gait normal Psychiatric: normal affect, behavior normal, pleasant  GU: normal male external genitalia, nontender, no masses, no hernia, no lymphadenopathy Rectal: Deferred   Assessment and Plan :   Encounter Diagnoses  Name Primary?   Encounter for health maintenance examination in adult Yes   Chronic nonintractable headache, unspecified headache type    Vaccine counseling    Need for Tdap vaccination    Screening for lipid disorders    Screening for diabetes mellitus    Screening for thyroid disorder    Snoring     This visit was a preventative care visit, also known as wellness visit or routine physical.   Topics typically include healthy lifestyle, diet, exercise, preventative care, vaccinations, sick and well care, proper use of emergency dept and after  hours care, as well as other concerns.     Recommendations: Continue to return yearly for your annual wellness and preventative care visits.  This gives [place] a chance to discuss healthy lifestyle, exercise, vaccinations, review your chart record, and perform screenings where appropriate.  I recommend you see your eye doctor yearly for routine vision care.  I recommend you see your dentist yearly for routine dental care including hygiene visits twice yearly.   Vaccination recommendations were reviewed Immunization History  Administered Date(s) Administered   Moderna Sars-Covid-2 Vaccination 10/27/2019, 11/24/2019, 08/11/2020   Tdap 04/02/2021    I recommend a yearly flu shot in the fall  Counseled on the Tdap (tetanus, diptheria, and acellular pertussis) vaccine.  Vaccine information sheet given. Tdap vaccine given after consent obtained.    Screening for cancer: Colon cancer screening: Age 42  Testicular cancer screening You should do a monthly self testicular exam if you are between 56-15 years old  We discussed PSA, prostate exam, and prostate cancer screening risks/benefits.   Age 66  Skin cancer screening: Check your skin regularly for new changes, growing lesions, or other lesions of concern Come in for evaluation if you have skin lesions of concern.  Lung cancer screening: If you have a greater than 20 pack year history of tobacco use, then you may qualify for  lung cancer screening with a chest CT scan.   Please call your insurance company to inquire about coverage for this test.  We currently don't have screenings for other cancers besides breast, cervical, colon, and lung cancers.  If you have a strong family history of cancer or have other cancer screening concerns, please let me know.    Bone health: Get at least 150 minutes of aerobic exercise weekly Get weight bearing exercise at least once weekly Bone density test:  A bone density test is an imaging test that  uses a type of X-ray to measure the amount of calcium and other minerals in your bones. The test may be used to diagnose or screen you for a condition that causes weak or thin bones (osteoporosis), predict your risk for a broken bone (fracture), or determine how well your osteoporosis treatment is working. The bone density test is recommended for females 65 and older, or females or males <65 if certain risk factors such as thyroid disease, long term use of steroids such as for asthma or rheumatological issues, vitamin D deficiency, estrogen deficiency, family history of osteoporosis, self or family history of fragility fracture in first degree relative.    Heart health: Get at least 150 minutes of aerobic exercise weekly Limit alcohol It is important to maintain a healthy blood pressure and healthy cholesterol numbers  Heart disease screening: Screening for heart disease includes screening for blood pressure, fasting lipids, glucose/diabetes screening, BMI height to weight ratio, reviewed of smoking status, physical activity, and diet.    Goals include blood pressure 120/80 or less, maintaining a healthy lipid/cholesterol profile, preventing diabetes or keeping diabetes numbers under good control, not smoking or using tobacco products, exercising most days per week or at least 150 minutes per week of exercise, and eating healthy variety of fruits and vegetables, healthy oils, and avoiding unhealthy food choices like fried food, fast food, high sugar and high cholesterol foods.    Other tests may possibly include EKG test, CT coronary calcium score, echocardiogram, exercise treadmill stress test.    Medical care options: I recommend you continue to seek care here first for routine care.  We try really hard to have available appointments Monday through Friday daytime hours for sick visits, acute visits, and physicals.  Urgent care should be used for after hours and weekends for significant issues  that cannot wait till the next day.  The emergency department should be used for significant potentially life-threatening emergencies.  The emergency department is expensive, can often have long wait times for less significant concerns, so try to utilize primary care, urgent care, or telemedicine when possible to avoid unnecessary trips to the emergency department.  Virtual visits and telemedicine have been introduced since the pandemic started in 2020, and can be convenient ways to receive medical care.  We offer virtual appointments as well to assist you in a variety of options to seek medical care.    Separate significant issues discussed: Chronic headaches-none really bad a recent the when he gets them they can be pretty bad.  We discussed using Tylenol or Excedrin for mild headaches.  Can begin trial of Imitrex as needed for worse headache.  Discussed proper use of medication  We discussed his MRI of brain and angiogram in May that showed chronic sinusitis.  Begin trial of Augmentin  Snoring-we discussed possibly doing a sleep study given some of his symptoms and risk for sleep apnea as well as his chronic headaches  I expressed  sympathy for his recent loss of his brother and aunt   Sayf was seen today for annual exam.  Diagnoses and all orders for this visit:  Encounter for health maintenance examination in adult -     Hemoglobin A1c -     Hepatic function panel -     Lipid panel -     TSH  Chronic nonintractable headache, unspecified headache type  Vaccine counseling  Need for Tdap vaccination  Screening for lipid disorders -     Lipid panel  Screening for diabetes mellitus -     Hemoglobin A1c  Screening for thyroid disorder -     TSH  Snoring  Other orders -     Tdap vaccine greater than or equal to 7yo IM -     SUMAtriptan (IMITREX) 50 MG tablet; Take 1 tablet (50 mg total) by mouth every 2 (two) hours as needed for migraine. May repeat in 2 hours if headache  persists or recurs. -     amoxicillin-clavulanate (AUGMENTIN) 875-125 MG tablet; Take 1 tablet by mouth 2 (two) times daily.   Follow-up pending labs, yearly for physical

## 2021-04-03 LAB — HEPATIC FUNCTION PANEL
ALT: 41 IU/L (ref 0–44)
AST: 25 IU/L (ref 0–40)
Albumin: 4.9 g/dL (ref 4.0–5.0)
Alkaline Phosphatase: 88 IU/L (ref 44–121)
Bilirubin Total: 0.6 mg/dL (ref 0.0–1.2)
Bilirubin, Direct: 0.15 mg/dL (ref 0.00–0.40)
Total Protein: 7.6 g/dL (ref 6.0–8.5)

## 2021-04-03 LAB — LIPID PANEL
Chol/HDL Ratio: 6.1 ratio — ABNORMAL HIGH (ref 0.0–5.0)
Cholesterol, Total: 195 mg/dL (ref 100–199)
HDL: 32 mg/dL — ABNORMAL LOW (ref >39)
LDL Chol Calc (NIH): 110 mg/dL — ABNORMAL HIGH (ref 0–99)
Triglycerides: 306 mg/dL — ABNORMAL HIGH (ref 0–149)
VLDL Cholesterol Cal: 53 mg/dL — ABNORMAL HIGH (ref 5–40)

## 2021-04-03 LAB — HEMOGLOBIN A1C
Est. average glucose Bld gHb Est-mCnc: 117 mg/dL
Hgb A1c MFr Bld: 5.7 % — ABNORMAL HIGH (ref 4.8–5.6)

## 2021-04-03 LAB — TSH: TSH: 0.84 u[IU]/mL (ref 0.450–4.500)

## 2021-04-03 NOTE — Progress Notes (Signed)
Lvm for pt to call back for labs . KH

## 2021-05-06 DIAGNOSIS — Z0000 Encounter for general adult medical examination without abnormal findings: Secondary | ICD-10-CM | POA: Diagnosis not present

## 2021-05-06 DIAGNOSIS — E782 Mixed hyperlipidemia: Secondary | ICD-10-CM | POA: Diagnosis not present

## 2021-06-17 DIAGNOSIS — J029 Acute pharyngitis, unspecified: Secondary | ICD-10-CM | POA: Diagnosis not present

## 2021-06-17 DIAGNOSIS — H6692 Otitis media, unspecified, left ear: Secondary | ICD-10-CM | POA: Diagnosis not present

## 2021-06-17 DIAGNOSIS — R0981 Nasal congestion: Secondary | ICD-10-CM | POA: Diagnosis not present

## 2021-06-17 DIAGNOSIS — R059 Cough, unspecified: Secondary | ICD-10-CM | POA: Diagnosis not present

## 2021-06-17 DIAGNOSIS — Z20822 Contact with and (suspected) exposure to covid-19: Secondary | ICD-10-CM | POA: Diagnosis not present

## 2021-08-25 DIAGNOSIS — E782 Mixed hyperlipidemia: Secondary | ICD-10-CM

## 2021-08-25 DIAGNOSIS — R7301 Impaired fasting glucose: Secondary | ICD-10-CM

## 2021-08-25 HISTORY — DX: Impaired fasting glucose: R73.01

## 2021-08-25 HISTORY — DX: Mixed hyperlipidemia: E78.2

## 2022-04-07 ENCOUNTER — Encounter: Payer: Self-pay | Admitting: Medical

## 2022-04-07 ENCOUNTER — Ambulatory Visit (INDEPENDENT_AMBULATORY_CARE_PROVIDER_SITE_OTHER): Payer: BC Managed Care – PPO | Admitting: Medical

## 2022-04-07 VITALS — BP 110/70 | HR 100 | Ht 65.0 in | Wt 169.0 lb

## 2022-04-07 DIAGNOSIS — Z87442 Personal history of urinary calculi: Secondary | ICD-10-CM

## 2022-04-07 DIAGNOSIS — M25561 Pain in right knee: Secondary | ICD-10-CM | POA: Diagnosis not present

## 2022-04-07 DIAGNOSIS — Z131 Encounter for screening for diabetes mellitus: Secondary | ICD-10-CM | POA: Diagnosis not present

## 2022-04-07 DIAGNOSIS — Z13220 Encounter for screening for lipoid disorders: Secondary | ICD-10-CM | POA: Diagnosis not present

## 2022-04-07 DIAGNOSIS — Z0000 Encounter for general adult medical examination without abnormal findings: Secondary | ICD-10-CM | POA: Diagnosis not present

## 2022-04-07 DIAGNOSIS — M545 Low back pain, unspecified: Secondary | ICD-10-CM | POA: Insufficient documentation

## 2022-04-07 LAB — POCT URINALYSIS DIP (PROADVANTAGE DEVICE)
Bilirubin, UA: NEGATIVE
Blood, UA: NEGATIVE
Glucose, UA: NEGATIVE mg/dL
Ketones, POC UA: NEGATIVE mg/dL
Leukocytes, UA: NEGATIVE
Nitrite, UA: NEGATIVE
Protein Ur, POC: NEGATIVE mg/dL
Specific Gravity, Urine: 1.01
Urobilinogen, Ur: NEGATIVE
pH, UA: 6 (ref 5.0–8.0)

## 2022-04-07 NOTE — Progress Notes (Signed)
Subjective:   HPI  Daniel Wang is a 36 y.o. male who presents for Chief Complaint  Patient presents with   fasting cpe    Fasting cpe, pain on right side x 1 week,     Patient Care Team: Dmiya Malphrus, Leward Quan as PCP - General (Family Medicine) Sees dentist Sees eye doctor  Concerns: He notes some pains on right low back, right flank x 2 weeks or so ago, maybe less.   Has history of kidney stones so not sure about the pain source.  Has hx/o kidney stones once prior.  Having some pains in left leg in last month, sometimes tingling or burning sensation.   Was worse standing at work recently.    May be developing hernia at belly button area.   Has been feeling some pain since moving some boxes at home.  Gets some pains in right knee.  No injury, no swelling, pain on lateral side.   Reviewed their medical, surgical, family, social, medication, and allergy history and updated chart as appropriate.  Past Medical History:  Diagnosis Date   GERD (gastroesophageal reflux disease)    Headache    chronic as of 10/2020   History of kidney stones    Wears glasses     Past Surgical History:  Procedure Laterality Date   CYSTOSCOPY/URETEROSCOPY/HOLMIUM LASER Right 05/22/2017   Procedure: CYSTOSCOPY/RIGHT URETEROSCOPY/RIGHT RETROGRADE PYELOGRAM WITH STENT PLACEMENT;  Surgeon: Lucas Mallow, MD;  Location: [facility];  Service: Urology;  Laterality: Right;    Family History  Problem Relation Age of Onset   Hypertension Mother    Hyperlipidemia Mother    Thyroid disease Mother    Diabetes Sister    Other Brother        died December 12, 2020 in [place]   Other Brother        congenital   Other Maternal Aunt     No current outpatient medications on file.  No Known Allergies   Review of Systems Constitutional: -fever, -chills, -sweats, -unexpected weight change, -decreased appetite, -fatigue Allergy: -sneezing, -itching, -congestion Dermatology: -changing moles, --rash,  -lumps ENT: -runny nose, -ear pain, -sore throat, -hoarseness, -sinus pain, -teeth pain, - ringing in ears, -hearing loss, -nosebleeds Cardiology: -chest pain, -palpitations, -swelling, -difficulty breathing when lying flat, -waking up short of breath Respiratory: -cough, -shortness of breath, -difficulty breathing with exercise or exertion, -wheezing, -coughing up blood Gastroenterology: -abdominal pain, -nausea, -vomiting, -diarrhea, -constipation, -blood in stool, -changes in bowel movement, -difficulty swallowing or eating Hematology: -bleeding, -bruising  Musculoskeletal: +joint aches, +muscle aches, -joint swelling, +back pain, -neck pain, -cramping, -changes in gait Ophthalmology: denies vision changes, eye redness, itching, discharge Urology: -burning with urination, -difficulty urinating, -blood in urine, -urinary frequency, -urgency, -incontinence Neurology: -headache, -weakness, +tingling, -numbness, -memory loss, -falls, -dizziness Psychology: -depressed mood, -agitation, -sleep problems Male GU: no testicular mass, pain, no lymph nodes swollen, no swelling, no rash.     04/07/2022    8:26 AM 04/02/2021    2:14 PM 10/24/2020   10:22 AM 11/16/2015    4:47 PM  Depression screen PHQ 2/9  Decreased Interest 0 0 0 0  Down, Depressed, Hopeless 0 0 0 0  PHQ - 2 Score 0 0 0 0        Objective:  BP 110/70   Pulse 100   Ht 5\' 5"  (1.651 m)   Wt 169 lb (76.7 kg)   BMI 28.12 kg/m   General appearance: alert, no distress, WD/WN, Hispanic male Skin:  tattoo upper mid back, otherwise skin unremarkable HEENT: normocephalic, conjunctiva/corneas normal, sclerae anicteric, PERRLA, EOMi, nares patent, no discharge or erythema, pharynx normal Oral cavity: MMM, tongue normal, teeth normal Neck: supple, no lymphadenopathy, no thyromegaly, no masses, normal ROM, no bruits Chest: non tender, normal shape and expansion Heart: RRR, normal S1, S2, no murmurs Lungs: CTA bilaterally, no wheezes,  rhonchi, or rales Abdomen: +bs, soft, small slightly tender somewhat non reducible umbilical hernia, otherwise non tender, non distended, no masses, no hepatomegaly, no splenomegaly, no bruits Back: mild right lumbar spine paraspinal tendnerss, otherwise non tender, normal ROM, no scoliosis Musculoskeletal: tender right knee lateral collateral ligament but no swelling, no laxity, tender over distal right IT band , otherwise upper extremities non tender, no obvious deformity, normal ROM throughout, lower extremities non tender, no obvious deformity, normal ROM throughout Extremities: no edema, no cyanosis, no clubbing Pulses: 2+ symmetric, upper and lower extremities, normal cap refill Neurological: alert, oriented x 3, CN2-12 intact, strength normal upper extremities and lower extremities, sensation normal throughout, DTRs 2+ throughout, no cerebellar signs, gait normal Psychiatric: normal affect, behavior normal, pleasant  GU: normal male external genitalia,uncircumcised, nontender, no masses, no hernia, no lymphadenopathy Rectal: deferred  Assessment and Plan :   Encounter Diagnoses  Name Primary?   Encounter for health maintenance examination in adult Yes   Acute right-sided low back pain, unspecified whether sciatica present    Right knee pain, unspecified chronicity    History of kidney stones    Screening for lipid disorders    Screening for diabetes mellitus     This visit was a preventative care visit, also known as wellness visit or routine physical.   Topics typically include healthy lifestyle, diet, exercise, preventative care, vaccinations, sick and well care, proper use of emergency dept and after hours care, as well as other concerns.     Recommendations: Continue to return yearly for your annual wellness and preventative care visits.  This gives [place] a chance to discuss healthy lifestyle, exercise, vaccinations, review your chart record, and perform screenings where  appropriate.  I recommend you see your eye doctor yearly for routine vision care.  I recommend you see your dentist yearly for routine dental care including hygiene visits twice yearly.   Vaccination recommendations were reviewed Immunization History  Administered Date(s) Administered   [employer] Vaccination 10/27/2019, 11/24/2019, 08/11/2020   Tdap 04/02/2021    Screening for cancer: Colon cancer screening: Age 38  Testicular cancer screening You should do a monthly self testicular exam if you are between 42-38 years old  We discussed PSA, prostate exam, and prostate cancer screening risks/benefits.     Skin cancer screening: Check your skin regularly for new changes, growing lesions, or other lesions of concern Come in for evaluation if you have skin lesions of concern.  Lung cancer screening: If you have a greater than 20 pack year history of tobacco use, then you may qualify for lung cancer screening with a chest CT scan.   Please call your insurance company to inquire about coverage for this test.  We currently don't have screenings for other cancers besides breast, cervical, colon, and lung cancers.  If you have a strong family history of cancer or have other cancer screening concerns, please let me know.    Bone health: Get at least 150 minutes of aerobic exercise weekly Get weight bearing exercise at least once weekly Bone density test:  A bone density test is an imaging test that uses a  type of X-ray to measure the amount of calcium and other minerals in your bones. The test may be used to diagnose or screen you for a condition that causes weak or thin bones (osteoporosis), predict your risk for a broken bone (fracture), or determine how well your osteoporosis treatment is working. The bone density test is recommended for females 73 and older, or females or males [id number] if certain risk factors such as thyroid disease, long term use of steroids such as for  asthma or rheumatological issues, vitamin D deficiency, estrogen deficiency, family history of osteoporosis, self or family history of fragility fracture in first degree relative.    Heart health: Get at least 150 minutes of aerobic exercise weekly Limit alcohol It is important to maintain a healthy blood pressure and healthy cholesterol numbers  Heart disease screening: Screening for heart disease includes screening for blood pressure, fasting lipids, glucose/diabetes screening, BMI height to weight ratio, reviewed of smoking status, physical activity, and diet.    Goals include blood pressure 120/80 or less, maintaining a healthy lipid/cholesterol profile, preventing diabetes or keeping diabetes numbers under good control, not smoking or using tobacco products, exercising most days per week or at least 150 minutes per week of exercise, and eating healthy variety of fruits and vegetables, healthy oils, and avoiding unhealthy food choices like fried food, fast food, high sugar and high cholesterol foods.    Other tests may possibly include EKG test, CT coronary calcium score, echocardiogram, exercise treadmill stress test.     Medical care options: I recommend you continue to seek care here first for routine care.  We try really hard to have available appointments Monday through Friday daytime hours for sick visits, acute visits, and physicals.  Urgent care should be used for after hours and weekends for significant issues that cannot wait till the next day.  The emergency department should be used for significant potentially life-threatening emergencies.  The emergency department is expensive, can often have long wait times for less significant concerns, so try to utilize primary care, urgent care, or telemedicine when possible to avoid unnecessary trips to the emergency department.  Virtual visits and telemedicine have been introduced since the pandemic started in 2020, and can be convenient  ways to receive medical care.  We offer virtual appointments as well to assist you in a variety of options to seek medical care.   Advanced Directives: I recommend you consider completing a [place] and Living Will.   These documents respect your wishes and help alleviate burdens on your loved ones if you were to become terminally ill or be in a position to need those documents enforced.    You can complete Advanced Directives yourself, have them notarized, then have copies made for our office, for you and for anybody you feel should have them in safe keeping.  Or, you can have an attorney prepare these documents.   If you haven't updated your Last Will and Testament in a while, it may be worthwhile having an attorney prepare these documents together and save on some costs.       Separate significant issues discussed: Right lateral knee pain Possible sprain the lateral collateral ligament versus IT band inflammation I recommend trying a knee sleeve over-the-counter for the next week and see if this helps Do some stretching daily, you can do ice water 20 minutes at a time for cold therapy to the knee when achy You can use some Aleve over-the-counter once  or twice daily for the next week for back and knee pain If this continues to give you problems it might be worth referral to physical therapy  Back pain This could be musculoskeletal related to recent lifting however if you start getting worse or more severe abdominal or back pain you may need to do some type of imaging for kidney stone However I suspect this is musculoskeletal achiness and strain Consider chiropractic therapy for your back and neck Do some stretching regularly Get in the habit of doing some aerobic and weight bearing exercise regularly  Umbilical hernia If this continues to give you problems in the coming weeks, then we can refer to general surgery for consult   Romon was seen today for fasting  cpe.  Diagnoses and all orders for this visit:  Encounter for health maintenance examination in adult -     Lipid panel -     CBC -     Comprehensive metabolic panel -     POCT Urinalysis DIP ([employer] Device) -     Hemoglobin A1c  Acute right-sided low back pain, unspecified whether sciatica present  Right knee pain, unspecified chronicity  History of kidney stones  Screening for lipid disorders -     Lipid panel  Screening for diabetes mellitus -     Hemoglobin A1c    Follow-up pending labs, yearly for physical

## 2022-04-07 NOTE — Patient Instructions (Signed)
This visit was a preventative care visit, also known as wellness visit or routine physical.   Topics typically include healthy lifestyle, diet, exercise, preventative care, vaccinations, sick and well care, proper use of emergency dept and after hours care, as well as other concerns.     Recommendations: Continue to return yearly for your annual wellness and preventative care visits.  This gives [place] a chance to discuss healthy lifestyle, exercise, vaccinations, review your chart record, and perform screenings where appropriate.  I recommend you see your eye doctor yearly for routine vision care.  I recommend you see your dentist yearly for routine dental care including hygiene visits twice yearly.   Vaccination recommendations were reviewed Immunization History  Administered Date(s) Administered   [employer] Vaccination 10/27/2019, 11/24/2019, 08/11/2020   Tdap 04/02/2021    Screening for cancer: Colon cancer screening: Age 38  Testicular cancer screening You should do a monthly self testicular exam if you are between 38-38 years old  We discussed PSA, prostate exam, and prostate cancer screening risks/benefits.     Skin cancer screening: Check your skin regularly for new changes, growing lesions, or other lesions of concern Come in for evaluation if you have skin lesions of concern.  Lung cancer screening: If you have a greater than 20 pack year history of tobacco use, then you may qualify for lung cancer screening with a chest CT scan.   Please call your insurance company to inquire about coverage for this test.  We currently don't have screenings for other cancers besides breast, cervical, colon, and lung cancers.  If you have a strong family history of cancer or have other cancer screening concerns, please let me know.    Bone health: Get at least 150 minutes of aerobic exercise weekly Get weight bearing exercise at least once weekly Bone density test:  A bone  density test is an imaging test that uses a type of X-ray to measure the amount of calcium and other minerals in your bones. The test may be used to diagnose or screen you for a condition that causes weak or thin bones (osteoporosis), predict your risk for a broken bone (fracture), or determine how well your osteoporosis treatment is working. The bone density test is recommended for females 38 and older, or females or males <65 if certain risk factors such as thyroid disease, long term use of steroids such as for asthma or rheumatological issues, vitamin D deficiency, estrogen deficiency, family history of osteoporosis, self or family history of fragility fracture in first degree relative.    Heart health: Get at least 150 minutes of aerobic exercise weekly Limit alcohol It is important to maintain a healthy blood pressure and healthy cholesterol numbers  Heart disease screening: Screening for heart disease includes screening for blood pressure, fasting lipids, glucose/diabetes screening, BMI height to weight ratio, reviewed of smoking status, physical activity, and diet.    Goals include blood pressure 120/80 or less, maintaining a healthy lipid/cholesterol profile, preventing diabetes or keeping diabetes numbers under good control, not smoking or using tobacco products, exercising most days per week or at least 150 minutes per week of exercise, and eating healthy variety of fruits and vegetables, healthy oils, and avoiding unhealthy food choices like fried food, fast food, high sugar and high cholesterol foods.    Other tests may possibly include EKG test, CT coronary calcium score, echocardiogram, exercise treadmill stress test.     Medical care options: I recommend you continue to seek care here first  for routine care.  We try really hard to have available appointments Monday through Friday daytime hours for sick visits, acute visits, and physicals.  Urgent care should be used for after  hours and weekends for significant issues that cannot wait till the next day.  The emergency department should be used for significant potentially life-threatening emergencies.  The emergency department is expensive, can often have long wait times for less significant concerns, so try to utilize primary care, urgent care, or telemedicine when possible to avoid unnecessary trips to the emergency department.  Virtual visits and telemedicine have been introduced since the pandemic started in 2020, and can be convenient ways to receive medical care.  We offer virtual appointments as well to assist you in a variety of options to seek medical care.   Advanced Directives: I recommend you consider completing a Health Care Power of Attorney and Living Will.   These documents respect your wishes and help alleviate burdens on your loved ones if you were to become terminally ill or be in a position to need those documents enforced.    You can complete Advanced Directives yourself, have them notarized, then have copies made for our office, for you and for anybody you feel should have them in safe keeping.  Or, you can have an attorney prepare these documents.   If you haven't updated your Last Will and Testament in a while, it may be worthwhile having an attorney prepare these documents together and save on some costs.       Separate significant issues discussed: Right lateral knee pain Possible sprain the lateral collateral ligament versus IT band inflammation I recommend trying a knee sleeve over-the-counter for the next week and see if this helps Do some stretching daily, you can do ice water 20 minutes at a time for cold therapy to the knee when achy You can use some Aleve over-the-counter once or twice daily for the next week for back and knee pain If this continues to give you problems it might be worth referral to physical therapy  Back pain This could be musculoskeletal related to recent lifting  however if you start getting worse or more severe abdominal or back pain you may need to do some type of imaging for kidney stone However I suspect this is musculoskeletal achiness and strain Consider chiropractic therapy for your back and neck Do some stretching regularly Get in the habit of doing some aerobic and weight bearing exercise regularly  Umbilical hernia If this continues to give you problems in the coming weeks, then we can refer to general surgery for consult

## 2022-04-08 ENCOUNTER — Other Ambulatory Visit: Payer: Self-pay | Admitting: Medical

## 2022-04-08 LAB — COMPREHENSIVE METABOLIC PANEL
ALT: 24 IU/L (ref 0–44)
AST: 18 IU/L (ref 0–40)
Albumin/Globulin Ratio: 1.9 (ref 1.2–2.2)
Albumin: 4.7 g/dL (ref 4.1–5.1)
Alkaline Phosphatase: 72 IU/L (ref 44–121)
BUN/Creatinine Ratio: 15 (ref 9–20)
BUN: 14 mg/dL (ref 6–20)
Bilirubin Total: 0.5 mg/dL (ref 0.0–1.2)
CO2: 23 mmol/L (ref 20–29)
Calcium: 9.7 mg/dL (ref 8.7–10.2)
Chloride: 103 mmol/L (ref 96–106)
Creatinine, Ser: 0.94 mg/dL (ref 0.76–1.27)
Globulin, Total: 2.5 g/dL (ref 1.5–4.5)
Glucose: 100 mg/dL — ABNORMAL HIGH (ref 70–99)
Potassium: 4.4 mmol/L (ref 3.5–5.2)
Sodium: 140 mmol/L (ref 134–144)
Total Protein: 7.2 g/dL (ref 6.0–8.5)
eGFR: 106 mL/min/{1.73_m2} (ref >59)

## 2022-04-08 LAB — CBC
Hematocrit: 45.4 % (ref 37.5–51.0)
Hemoglobin: 15.1 g/dL (ref 13.0–17.7)
MCH: 29.4 pg (ref 26.6–33.0)
MCHC: 33.3 g/dL (ref 31.5–35.7)
MCV: 89 fL (ref 79–97)
Platelets: 281 10*3/uL (ref 150–450)
RBC: 5.13 x10E6/uL (ref 4.14–5.80)
RDW: 13 % (ref 11.6–15.4)
WBC: 4.9 10*3/uL (ref 3.4–10.8)

## 2022-04-08 LAB — LIPID PANEL
Chol/HDL Ratio: 6.8 ratio — ABNORMAL HIGH (ref 0.0–5.0)
Cholesterol, Total: 189 mg/dL (ref 100–199)
HDL: 28 mg/dL — ABNORMAL LOW (ref >39)
LDL Chol Calc (NIH): 101 mg/dL — ABNORMAL HIGH (ref 0–99)
Triglycerides: 354 mg/dL — ABNORMAL HIGH (ref 0–149)
VLDL Cholesterol Cal: 60 mg/dL — ABNORMAL HIGH (ref 5–40)

## 2022-04-08 LAB — HEMOGLOBIN A1C
Est. average glucose Bld gHb Est-mCnc: 120 mg/dL
Hgb A1c MFr Bld: 5.8 % — ABNORMAL HIGH (ref 4.8–5.6)

## 2022-04-08 MED ORDER — GEMFIBROZIL 600 MG PO TABS: 600.0000 mg | ORAL_TABLET | Freq: Two times a day (BID) | ORAL | 0 refills | Status: DC

## 2022-04-30 ENCOUNTER — Encounter: Payer: Self-pay | Admitting: Internal Medicine

## 2022-06-03 ENCOUNTER — Encounter: Payer: Self-pay | Admitting: Internal Medicine

## 2022-06-04 DIAGNOSIS — M79675 Pain in left toe(s): Secondary | ICD-10-CM | POA: Diagnosis not present

## 2022-06-20 ENCOUNTER — Ambulatory Visit (INDEPENDENT_AMBULATORY_CARE_PROVIDER_SITE_OTHER): Payer: BC Managed Care – PPO | Admitting: Medical

## 2022-06-20 VITALS — BP 110/70 | HR 75 | Temp 97.5°F | Wt 157.8 lb

## 2022-06-20 DIAGNOSIS — M2550 Pain in unspecified joint: Secondary | ICD-10-CM

## 2022-06-20 MED ORDER — COLCHICINE 0.6 MG PO TABS: 0.6000 mg | ORAL_TABLET | Freq: Two times a day (BID) | ORAL | 0 refills | Status: DC

## 2022-06-20 MED ORDER — PREDNISONE 20 MG PO TABS: ORAL_TABLET | ORAL | 0 refills | Status: DC

## 2022-06-20 MED ORDER — HYDROCODONE-ACETAMINOPHEN 5-325 MG PO TABS: 1.0000 | ORAL_TABLET | Freq: Four times a day (QID) | ORAL | 0 refills | Status: DC | PRN

## 2022-06-20 NOTE — Progress Notes (Signed)
Subjective:  Daniel Wang is a 36 y.o. male who presents for Chief Complaint  Patient presents with   left foot gout    Left foot gout. Went to UC and was given colocine #3 tablets and prednisone. Still having pain     Here for concern for gout.  Went to urgent care 2 weeks ago for new onset swollen left great toe with redness.  No recent injury, fall, trauma.  No hx/o gout but does have history of kidney stones once.  Not sure what type of stone it was.  In recent weeks having some other joint pains in both knees, left elbow, both feet, but no swelling of joints.  No morning stiffness.  No family history of rheumatoid conditions.    No other aggravating or relieving factors.    No other c/o.  Past Medical History:  Diagnosis Date   GERD (gastroesophageal reflux disease)    Headache    chronic as of 10/2020   History of kidney stones    Wears glasses    Family History  Problem Relation Age of Onset   Hypertension Mother    Hyperlipidemia Mother    Thyroid disease Mother    Diabetes Sister    Other Brother        died 12-20-2020 in [place]   Other Brother        congenital   Other Maternal Aunt      The following portions of the patient's history were reviewed and updated as appropriate: allergies, current medications, past family history, past medical history, past social history, past surgical history and problem list.  ROS Otherwise as in subjective above  Objective: BP 110/70   Pulse 75   Temp (!) 97.5 F (36.4 C)   Wt 157 lb 12.8 oz (71.6 kg)   BMI 26.26 kg/m   General appearance: alert, no distress, well developed, well nourished Left great toe MTP with pinkish coloration, mild warmth, +localized tendnerss and swelling, decreased ROM of MTP, but otherwise feet unremarkable.  No obvious tendnerss swelling or decrease ROM of knees and left elbow. Pulses: 2+ radial pulses, 2+ pedal pulses, normal cap refill Ext: no edema   Assessment: Encounter Diagnosis   Name Primary?   Polyarthralgia Yes     Plan: Discussed case with supervising physician Dr. Redmond School who also examined him.  We discussed that he may potentially need to be on allopurinol if he gets other flareups.  For now we will use the medications below to help improve the current flare.  Continue good hydration.  Mixed dyslipidemia primarily hypertriglyceridemia-he is compliant with gemfibrozil started a few months ago and has follow-up next month fasting.  Counseled him on diet medication.  He has made dietary changes as he was eating a lot of junk food and sweets prior particular in July and August.   Breland was seen today for left foot gout.  Diagnoses and all orders for this visit:  Polyarthralgia  Other orders -     colchicine 0.6 MG tablet; Take 1 tablet (0.6 mg total) by mouth 2 (two) times daily. X 5-7 days for flareup -     predniSONE (DELTASONE) 20 MG tablet; 3 tablets today, 2 tablets tomorrow, 1 tablet the third day -     HYDROcodone-acetaminophen (NORCO) 5-325 MG tablet; Take 1 tablet by mouth every 6 (six) hours as needed.    Follow up: next month as scheduled

## 2022-06-29 ENCOUNTER — Other Ambulatory Visit: Payer: Self-pay | Admitting: Medical

## 2022-07-11 ENCOUNTER — Ambulatory Visit (INDEPENDENT_AMBULATORY_CARE_PROVIDER_SITE_OTHER): Payer: BC Managed Care – PPO | Admitting: Medical

## 2022-07-11 VITALS — BP 120/62 | HR 90 | Wt 154.6 lb

## 2022-07-11 DIAGNOSIS — Z8042 Family history of malignant neoplasm of prostate: Secondary | ICD-10-CM

## 2022-07-11 DIAGNOSIS — R300 Dysuria: Secondary | ICD-10-CM | POA: Insufficient documentation

## 2022-07-11 DIAGNOSIS — E782 Mixed hyperlipidemia: Secondary | ICD-10-CM | POA: Diagnosis not present

## 2022-07-11 DIAGNOSIS — R7301 Impaired fasting glucose: Secondary | ICD-10-CM

## 2022-07-11 DIAGNOSIS — R3911 Hesitancy of micturition: Secondary | ICD-10-CM

## 2022-07-11 DIAGNOSIS — M109 Gout, unspecified: Secondary | ICD-10-CM

## 2022-07-11 LAB — POCT URINALYSIS DIP (PROADVANTAGE DEVICE)
Bilirubin, UA: NEGATIVE
Blood, UA: NEGATIVE
Glucose, UA: NEGATIVE mg/dL
Ketones, POC UA: NEGATIVE mg/dL
Leukocytes, UA: NEGATIVE
Nitrite, UA: NEGATIVE
Protein Ur, POC: NEGATIVE mg/dL
Specific Gravity, Urine: 1.02
Urobilinogen, Ur: NEGATIVE
pH, UA: 6.5 (ref 5.0–8.0)

## 2022-07-11 NOTE — Progress Notes (Signed)
Subjective:  Daniel Wang is a 36 y.o. male who presents for Chief Complaint  Patient presents with   fasting follow-up    Fasting follow-up on Cholesterol labs     Here for f/u.  At his physical in August, we had him start gemfibrozil due to abnormal lipids.  His kids love [employer] and fast foot but trying to avoid those now. No tortilla for 3 months.  No red meats in last 3 months.  Doing much better on dietary changes lately.  He is tolerating gemfibrozil without complaint.  Last visit he had high triglycerides and low HDL.  Mother and grandmother has diabetes.   He had gout first episode a few months ago.  Still has bony appearing left great toe and recently had a blister.  It is healing but he has concerns about this.  Lately he has had some issues with urine.  He is drinking more water and only drinks 1 cup of coffee a day lately having some hesitancy with the urine.  No burning, no odor, no blood in urine.  He also learned that his father got diagnosed with prostate cancer in the past year.  He was in the 41s but is not sure how long he has had this.  No other aggravating or relieving factors.    No other c/o.   Past Medical History:  Diagnosis Date   GERD (gastroesophageal reflux disease)    Headache    chronic as of 10/2020   History of kidney stones    Wears glasses    Current Outpatient Medications on File Prior to Visit  Medication Sig Dispense Refill   colchicine 0.6 MG tablet Take 1 tablet (0.6 mg total) by mouth 2 (two) times daily. X 5-7 days for flareup 30 tablet 0   gemfibrozil (LOPID) 600 MG tablet TAKE 1 TABLET BY MOUTH TWICE DAILY BEFORE A MEAL 180 tablet 0   HYDROcodone-acetaminophen (NORCO) 5-325 MG tablet Take 1 tablet by mouth every 6 (six) hours as needed. (Patient not taking: Reported on 07/11/2022) 12 tablet 0   No current facility-administered medications on file prior to visit.     The following portions of the patient's history were  reviewed and updated as appropriate: allergies, current medications, past family history, past medical history, past social history, past surgical history and problem list.  ROS Otherwise as in subjective above    Objective: BP 120/62   Pulse 90   Wt 154 lb 9.6 oz (70.1 kg)   BMI 25.73 kg/m   General appearance: alert, no distress, well developed, well nourished Foot : left MTP with arthritis changes and some swelling, healing skin from recent blister Feet neurovascularly intact Otherwise not examined   Assessment: Encounter Diagnoses  Name Primary?   Mixed dyslipidemia    Gout involving toe of left foot, unspecified cause, unspecified chronicity    Family history of prostate cancer    Dysuria    Urinary hesitancy    Impaired fasting blood sugar Yes     Plan: Mixed dyslipidemia-updated labs today since starting gemfibrozil back in August.  He has made some dietary changes.  Discussed healthy low sugar low triglyceride diet.  Gout, first episode in August.  Still has fairly arthritic appearing left great toe.  He had a blister likely from rubbing of the skin issue recently.  Updated uric acid today to determine if we need to start allopurinol  Dysuria, urinary hesitancy-discussed possible causes which could include prostate inflammation, prostate infection, prostate  cancer, urinary tract infection, caffeine excess, constipation or other.  He does have a family history of prostate cancer in father.  We discussed typically we do not start screening till age 45.  Baseline PSA today given urinary symptom.    Impaired glucose-continue to work on healthy lifestyle, healthy diet and exercise  [name] was seen today for fasting follow-up.  Diagnoses and all orders for this visit:  Impaired fasting blood sugar  Mixed dyslipidemia -     Lipid panel -     ALT  Gout involving toe of left foot, unspecified cause, unspecified chronicity -     Uric acid  Family history of prostate  cancer -     PSA  Dysuria -     POCT Urinalysis DIP (Proadvantage Device) -     PSA  Urinary hesitancy -     POCT Urinalysis DIP (Proadvantage Device) -     PSA    Follow up: pending labs

## 2022-07-12 LAB — ALT: ALT: 16 IU/L (ref 0–44)

## 2022-07-12 LAB — LIPID PANEL
Chol/HDL Ratio: 6.1 ratio — ABNORMAL HIGH (ref 0.0–5.0)
Cholesterol, Total: 188 mg/dL (ref 100–199)
HDL: 31 mg/dL — ABNORMAL LOW (ref >39)
LDL Chol Calc (NIH): 125 mg/dL — ABNORMAL HIGH (ref 0–99)
Triglycerides: 178 mg/dL — ABNORMAL HIGH (ref 0–149)
VLDL Cholesterol Cal: 32 mg/dL (ref 5–40)

## 2022-07-12 LAB — PSA: Prostate Specific Ag, Serum: 0.3 ng/mL (ref 0.0–4.0)

## 2022-07-12 LAB — URIC ACID: Uric Acid: 7.1 mg/dL (ref 3.8–8.4)

## 2022-08-15 ENCOUNTER — Other Ambulatory Visit: Payer: Self-pay | Admitting: Medical

## 2022-08-15 MED ORDER — ALLOPURINOL 100 MG PO TABS: 100.0000 mg | ORAL_TABLET | Freq: Every day | ORAL | 1 refills | Status: DC

## 2023-01-29 ENCOUNTER — Ambulatory Visit (INDEPENDENT_AMBULATORY_CARE_PROVIDER_SITE_OTHER): Payer: BC Managed Care – PPO | Admitting: Medical

## 2023-01-29 VITALS — BP 122/84 | HR 77 | Temp 97.7°F | Wt 157.6 lb

## 2023-01-29 DIAGNOSIS — H538 Other visual disturbances: Secondary | ICD-10-CM

## 2023-01-29 DIAGNOSIS — M5440 Lumbago with sciatica, unspecified side: Secondary | ICD-10-CM | POA: Diagnosis not present

## 2023-01-29 DIAGNOSIS — M549 Dorsalgia, unspecified: Secondary | ICD-10-CM

## 2023-01-29 DIAGNOSIS — R200 Anesthesia of skin: Secondary | ICD-10-CM

## 2023-01-29 DIAGNOSIS — G8929 Other chronic pain: Secondary | ICD-10-CM | POA: Diagnosis not present

## 2023-01-29 DIAGNOSIS — R29898 Other symptoms and signs involving the musculoskeletal system: Secondary | ICD-10-CM

## 2023-01-29 NOTE — Progress Notes (Signed)
Subjective:  Daniel Wang is a 37 y.o. male who presents for Chief Complaint  Patient presents with   burning in both legs    Burning in both legs. Right leg is having some numbness x 1 week, left leg doesn't have numbness yet     Here for several concerns  He notes numbness and tingling in his legs for the last 6 to 7 months.  Started out mainly of the left leg but now involving the right leg 2.  He has a specific area of the left upper thigh that seems numb and tingly decree sensation compared to the right.  The both legs get numb at times.  Sometimes the left leg is numb all the way to the foot.  He gets some back pains in the low back but has fairly consistent upper back pain.  No fever, no incontinence, no sudden weight change.  No genital anesthesia. Sometimes has a burning/tingling sensation in the genital area.  History of gout.  He was on allopurinol really but recently decided to stop this.  He notes history of upper back pain and neck pain.  Lately he is getting a pressure sensation in back of neck.  Sometimes feels stiffness.  Sometimes gets stabbing pain in left side of head.  Recently had some blurry vision.  No loss of vision.  No polydipsia but does have polyuria.  However he drinks a lot of coffee.  He walks for exercise.  No other specific exercise.  No regular stretching.  No other aggravating or relieving factors.    No other c/o.  Past Medical History:  Diagnosis Date   GERD (gastroesophageal reflux disease)    Headache    chronic as of 10/2020   History of kidney stones    Wears glasses    Current Outpatient Medications on File Prior to Visit  Medication Sig Dispense Refill   allopurinol (ZYLOPRIM) 100 MG tablet Take 1 tablet (100 mg total) by mouth daily. 30 tablet 1   gemfibrozil (LOPID) 600 MG tablet TAKE 1 TABLET BY MOUTH TWICE DAILY BEFORE A MEAL 180 tablet 0   colchicine 0.6 MG tablet Take 1 tablet (0.6 mg total) by mouth 2 (two) times daily. X  5-7 days for flareup (Patient not taking: Reported on 01/29/2023) 30 tablet 0   No current facility-administered medications on file prior to visit.     The following portions of the patient's history were reviewed and updated as appropriate: allergies, current medications, past family history, past medical history, past social history, past surgical history and problem list.  ROS Otherwise as in subjective above  Objective: BP 122/84   Pulse 77   Temp 97.7 F (36.5 C)   Wt 157 lb 9.6 oz (71.5 kg)   BMI 26.23 kg/m   General appearance: alert, no distress, well developed, well nourished Neck with some midline and paraspinal posterior tenderness, range of motion neck fairly normal, no mass or lymphadenopathy, supple Upper back across the paraspinal region left and right tender, also tender in the midline spine in the mid thoracic region, mild tenderness in the lumbar paraspinal region, range of motion relatively normal Legs nontender without obvious deformity, normal range of motion No lower extremity edema Pulses normal upper and lower extremities Neuro: There is some decrease in sensation to the upper left thigh, slightly diminished sensation compared to the right thigh, otherwise sensation seems to be within normal limits of extremities, the left foot strength seems a little weaker compared  to the right, DTRs normal HEENT: normocephalic, sclerae anicteric, conjunctiva pink and moist, PERRLA, EOMi  MRI brain 12/23/2020 IMPRESSION: 1.  Normal noncontrast MRI appearance of the brain. 2. Normal intracranial MRA. 3. [facility] obstructive pattern of Left paranasal sinus disease.   MRA head 12/23/2020 IMPRESSION: 1.  Normal noncontrast MRI appearance of the brain. 2. Normal intracranial MRA. 3. [facility] obstructive pattern of Left paranasal sinus disease.   Normal C-spine x-ray 10/24/2020  Assessment: Encounter Diagnoses  Name Primary?   Upper back pain Yes   Chronic low back pain with  sciatica, sciatica laterality unspecified, unspecified back pain laterality    Bilateral leg numbness    Blurred vision    Weakness of left lower extremity      Plan: We discussed his variety of symptoms We discussed possible causes of leg numbness and tingling which could include radicular lumbar issue, bulging disc, vitamin deficiency, tumor or other Labs today.  If labs normal we will consider imaging of the lumbar spine, possible physical therapy referral or chiropractor consult Back pain, neck pain-consider physical therapy referral or chiropractor consult Blurred vision-discussed various possible causes.  He sees an eye doctor and his last eye doctor visit a few months ago there was no abnormalities.  Labs as below.  I reviewed his brain MRI and head angio from 2022 in the chart record   Jaymon was seen today for burning in both legs.  Diagnoses and all orders for this visit:  Upper back pain -     Vitamin B12 -     CBC -     TSH -     Basic metabolic panel  Chronic low back pain with sciatica, sciatica laterality unspecified, unspecified back pain laterality -     Vitamin B12 -     CBC -     TSH -     Basic metabolic panel  Bilateral leg numbness -     Vitamin B12 -     CBC -     TSH -     Basic metabolic panel  Blurred vision -     Vitamin B12 -     CBC -     TSH -     Basic metabolic panel  Weakness of left lower extremity    Follow up: pending labs

## 2023-01-30 ENCOUNTER — Other Ambulatory Visit: Payer: Self-pay | Admitting: Medical

## 2023-01-30 DIAGNOSIS — R200 Anesthesia of skin: Secondary | ICD-10-CM

## 2023-01-30 DIAGNOSIS — R29898 Other symptoms and signs involving the musculoskeletal system: Secondary | ICD-10-CM

## 2023-01-30 DIAGNOSIS — G8929 Other chronic pain: Secondary | ICD-10-CM

## 2023-01-30 LAB — BASIC METABOLIC PANEL
BUN/Creatinine Ratio: 15 (ref 9–20)
BUN: 13 mg/dL (ref 6–20)
CO2: 24 mmol/L (ref 20–29)
Calcium: 9.8 mg/dL (ref 8.7–10.2)
Chloride: 103 mmol/L (ref 96–106)
Creatinine, Ser: 0.89 mg/dL (ref 0.76–1.27)
Glucose: 85 mg/dL (ref 70–99)
Potassium: 4.4 mmol/L (ref 3.5–5.2)
Sodium: 142 mmol/L (ref 134–144)
eGFR: 112 mL/min/{1.73_m2} (ref >59)

## 2023-01-30 LAB — VITAMIN B12: Vitamin B-12: 322 pg/mL (ref 232–1245)

## 2023-01-30 LAB — TSH: TSH: 1.07 u[IU]/mL (ref 0.450–4.500)

## 2023-01-30 LAB — CBC
Hematocrit: 44.6 % (ref 37.5–51.0)
Hemoglobin: 15 g/dL (ref 13.0–17.7)
MCH: 29.8 pg (ref 26.6–33.0)
MCHC: 33.6 g/dL (ref 31.5–35.7)
MCV: 89 fL (ref 79–97)
Platelets: 358 10*3/uL (ref 150–450)
RBC: 5.03 x10E6/uL (ref 4.14–5.80)
RDW: 12.5 % (ref 11.6–15.4)
WBC: 5.7 10*3/uL (ref 3.4–10.8)

## 2023-01-30 NOTE — Progress Notes (Signed)
Results sent through MyChart

## 2023-02-13 ENCOUNTER — Ambulatory Visit
Admission: RE | Admit: 2023-02-13 | Discharge: 2023-02-13 | Disposition: A | Discharge status: Home / Self Care | Payer: BC Managed Care – PPO | Source: Ambulatory Visit | Attending: Medical | Admitting: Medical

## 2023-02-13 DIAGNOSIS — R200 Anesthesia of skin: Secondary | ICD-10-CM

## 2023-02-13 DIAGNOSIS — R202 Paresthesia of skin: Secondary | ICD-10-CM | POA: Diagnosis not present

## 2023-02-13 DIAGNOSIS — R29898 Other symptoms and signs involving the musculoskeletal system: Secondary | ICD-10-CM | POA: Diagnosis not present

## 2023-02-13 DIAGNOSIS — M545 Low back pain, unspecified: Secondary | ICD-10-CM | POA: Diagnosis not present

## 2023-02-13 DIAGNOSIS — G8929 Other chronic pain: Secondary | ICD-10-CM

## 2023-02-13 DIAGNOSIS — M5441 Lumbago with sciatica, right side: Secondary | ICD-10-CM | POA: Diagnosis not present

## 2023-02-15 NOTE — Progress Notes (Signed)
Lumbar spine x-ray was normal.  Next steps could include referral to spine specialist, repeat referral for physical therapy or move onto MRI lumbar spine to further evaluate the nerves and disc.  Either 1 would be an acceptable option  Let me know how you want to move forward

## 2023-02-20 ENCOUNTER — Other Ambulatory Visit: Payer: Self-pay | Admitting: Medical

## 2023-02-20 DIAGNOSIS — G8929 Other chronic pain: Secondary | ICD-10-CM

## 2023-02-20 DIAGNOSIS — M5410 Radiculopathy, site unspecified: Secondary | ICD-10-CM

## 2023-03-04 ENCOUNTER — Ambulatory Visit
Admission: RE | Admit: 2023-03-04 | Discharge: 2023-03-04 | Disposition: A | Discharge status: Home / Self Care | Payer: BC Managed Care – PPO | Source: Ambulatory Visit | Attending: Medical | Admitting: Medical

## 2023-03-04 DIAGNOSIS — M546 Pain in thoracic spine: Secondary | ICD-10-CM | POA: Diagnosis not present

## 2023-03-04 DIAGNOSIS — G8929 Other chronic pain: Secondary | ICD-10-CM

## 2023-03-04 DIAGNOSIS — M5410 Radiculopathy, site unspecified: Secondary | ICD-10-CM

## 2023-03-04 DIAGNOSIS — M5127 Other intervertebral disc displacement, lumbosacral region: Secondary | ICD-10-CM | POA: Diagnosis not present

## 2023-03-12 NOTE — Progress Notes (Signed)
Results sent through MyChart

## 2023-03-18 ENCOUNTER — Telehealth: Payer: Self-pay | Admitting: Medical

## 2023-03-18 DIAGNOSIS — G8929 Other chronic pain: Secondary | ICD-10-CM

## 2023-03-18 DIAGNOSIS — M5410 Radiculopathy, site unspecified: Secondary | ICD-10-CM

## 2023-03-18 DIAGNOSIS — R29898 Other symptoms and signs involving the musculoskeletal system: Secondary | ICD-10-CM

## 2023-03-18 DIAGNOSIS — R200 Anesthesia of skin: Secondary | ICD-10-CM

## 2023-03-18 NOTE — Telephone Encounter (Signed)
Pt left message that he was calling back about referral for physical therapy,  please call him back

## 2023-03-23 NOTE — Telephone Encounter (Signed)
Pt was notified and would like PT placed

## 2023-03-24 ENCOUNTER — Other Ambulatory Visit: Payer: Self-pay | Admitting: Medical

## 2023-03-24 MED ORDER — COLCHICINE 0.6 MG PO TABS: 0.6000 mg | ORAL_TABLET | Freq: Two times a day (BID) | ORAL | 1 refills | Status: DC

## 2023-03-24 MED ORDER — GEMFIBROZIL 600 MG PO TABS: 600.0000 mg | ORAL_TABLET | Freq: Two times a day (BID) | ORAL | 0 refills | Status: DC

## 2023-04-14 ENCOUNTER — Encounter: Payer: Self-pay | Admitting: Medical

## 2023-04-14 ENCOUNTER — Ambulatory Visit (INDEPENDENT_AMBULATORY_CARE_PROVIDER_SITE_OTHER): Payer: BC Managed Care – PPO | Admitting: Medical

## 2023-04-14 VITALS — BP 102/62 | HR 72 | Ht 64.5 in | Wt 155.6 lb

## 2023-04-14 DIAGNOSIS — Z7185 Encounter for immunization safety counseling: Secondary | ICD-10-CM | POA: Diagnosis not present

## 2023-04-14 DIAGNOSIS — Z0000 Encounter for general adult medical examination without abnormal findings: Secondary | ICD-10-CM

## 2023-04-14 DIAGNOSIS — E790 Hyperuricemia without signs of inflammatory arthritis and tophaceous disease: Secondary | ICD-10-CM | POA: Diagnosis not present

## 2023-04-14 DIAGNOSIS — R7301 Impaired fasting glucose: Secondary | ICD-10-CM | POA: Diagnosis not present

## 2023-04-14 DIAGNOSIS — E782 Mixed hyperlipidemia: Secondary | ICD-10-CM | POA: Diagnosis not present

## 2023-04-14 DIAGNOSIS — Z87442 Personal history of urinary calculi: Secondary | ICD-10-CM

## 2023-04-14 LAB — POCT URINALYSIS DIP (PROADVANTAGE DEVICE)
Bilirubin, UA: NEGATIVE
Blood, UA: NEGATIVE
Glucose, UA: NEGATIVE mg/dL
Ketones, POC UA: NEGATIVE mg/dL
Leukocytes, UA: NEGATIVE
Nitrite, UA: NEGATIVE
Protein Ur, POC: NEGATIVE mg/dL
Specific Gravity, Urine: 1.01
Urobilinogen, Ur: 0.2
pH, UA: 6.5 (ref 5.0–8.0)

## 2023-04-14 NOTE — Progress Notes (Signed)
Subjective:   HPI  Daniel Wang is a 37 y.o. male who presents for Chief Complaint  Patient presents with   Annual Exam    Fasting annual exam, see [facility] annually.     Patient Care Team: Tremar Wickens, Kermit Balo, PA-C as PCP - General (Family Medicine)   Concerns: None today  Starting physical therapy soon in regards to recent visit for back pain  Just moved daughter into college at [employer] and family just got back from vacation  Compliant with medicaiton for gout prevention and lipids without c/o.   Reviewed their medical, surgical, family, social, medication, and allergy history and updated chart as appropriate.  No Known Allergies  Past Medical History:  Diagnosis Date   GERD (gastroesophageal reflux disease)    Headache    chronic as of 10/2020   History of kidney stones    Impaired fasting blood sugar Apr 28, 2022   Mixed dyslipidemia 28-Apr-2022   Wears glasses     Current Outpatient Medications on File Prior to Visit  Medication Sig Dispense Refill   allopurinol (ZYLOPRIM) 100 MG tablet Take 1 tablet (100 mg total) by mouth daily. 30 tablet 1   gemfibrozil (LOPID) 600 MG tablet Take 1 tablet (600 mg total) by mouth 2 (two) times daily before a meal. 180 tablet 0   colchicine 0.6 MG tablet Take 1 tablet (0.6 mg total) by mouth 2 (two) times daily. X 5-7 days for flareup (Patient not taking: Reported on 04/14/2023) 30 tablet 1   No current facility-administered medications on file prior to visit.      Current Outpatient Medications:    allopurinol (ZYLOPRIM) 100 MG tablet, Take 1 tablet (100 mg total) by mouth daily., Disp: 30 tablet, Rfl: 1   gemfibrozil (LOPID) 600 MG tablet, Take 1 tablet (600 mg total) by mouth 2 (two) times daily before a meal., Disp: 180 tablet, Rfl: 0   colchicine 0.6 MG tablet, Take 1 tablet (0.6 mg total) by mouth 2 (two) times daily. X 5-7 days for flareup (Patient not taking: Reported on 04/14/2023), Disp: 30 tablet, Rfl: 1  Family History   Problem Relation Age of Onset   Hypertension Mother    Hyperlipidemia Mother    Thyroid disease Mother    Cancer Father 63       prostate   Diabetes Sister    Other Brother        died Apr 28, 2021 in MVA   Other Brother        congenital   Other Maternal Aunt     Past Surgical History:  Procedure Laterality Date   CYSTOSCOPY/URETEROSCOPY/HOLMIUM LASER Right 05/22/2017   Procedure: CYSTOSCOPY/RIGHT URETEROSCOPY/RIGHT RETROGRADE PYELOGRAM WITH STENT PLACEMENT;  Surgeon: Crista Elliot, MD;  Location: [facility];  Service: Urology;  Laterality: Right;    Review of Systems  Constitutional:  Negative for chills, fever, malaise/fatigue and weight loss.  HENT:  Negative for congestion, ear pain, hearing loss, sore throat and tinnitus.   Eyes:  Negative for blurred vision, pain and redness.  Respiratory:  Negative for cough, hemoptysis and shortness of breath.   Cardiovascular:  Negative for chest pain, palpitations, orthopnea, claudication and leg swelling.  Gastrointestinal:  Negative for abdominal pain, blood in stool, constipation, diarrhea, nausea and vomiting.  Genitourinary:  Negative for dysuria, flank pain, frequency, hematuria and urgency.  Musculoskeletal:  Negative for falls, joint pain and myalgias.  Skin:  Negative for itching and rash.  Neurological:  Negative for dizziness, tingling, speech  change, weakness and headaches.  Endo/Heme/Allergies:  Negative for polydipsia. Does not bruise/bleed easily.  Psychiatric/Behavioral:  Negative for depression and memory loss. The patient is not nervous/anxious and does not have insomnia.        Objective:  BP 102/62   Pulse 72   Ht 5' 4.5" (1.638 m)   Wt 155 lb 9.6 oz (70.6 kg)   SpO2 97%   BMI 26.30 kg/m   General appearance: alert, no distress, WD/WN, male Skin: unremarkable HEENT: normocephalic, conjunctiva/corneas normal, sclerae anicteric, PERRLA, EOMi, nares patent, no discharge or erythema, pharynx normal Oral cavity:  MMM, tongue normal, teeth normal Neck: supple, no lymphadenopathy, no thyromegaly, no masses, normal ROM, no bruits Chest: non tender, normal shape and expansion Heart: RRR, normal S1, S2, no murmurs Lungs: CTA bilaterally, no wheezes, rhonchi, or rales Abdomen: +bs, soft, non tender, non distended, no masses, no hepatomegaly, no splenomegaly, no bruits Back: non tender, normal ROM, no scoliosis Musculoskeletal: upper extremities non tender, no obvious deformity, normal ROM throughout, lower extremities non tender, no obvious deformity, normal ROM throughout Extremities: no edema, no cyanosis, no clubbing Pulses: 2+ symmetric, upper and lower extremities, normal cap refill Neurological: alert, oriented x 3, CN2-12 intact, strength normal upper extremities and lower extremities, sensation normal throughout, DTRs 2+ throughout, no cerebellar signs, gait normal Psychiatric: normal affect, behavior normal, pleasant  GU: normal male external genitalia,uncircumcised, nontender, no masses, no hernia, no lymphadenopathy Rectal: deferred    Assessment and Plan :   Encounter Diagnoses  Name Primary?   Encounter for health maintenance examination in adult Yes   Vaccine counseling    Mixed dyslipidemia    Impaired fasting blood sugar    History of kidney stones    Elevated uric acid in blood     This visit was a preventative care visit, also known as wellness visit or routine physical.   Topics typically include healthy lifestyle, diet, exercise, preventative care, vaccinations, sick and well care, proper use of emergency dept and after hours care, as well as other concerns.     Separate significant issues discussed: Mixed dyslipidemia - labs today, compliant with lopid BID  Hx/o gout and elevated uric acid - compliant with allopurinol 100mg  daily   General Recommendations: Continue to return yearly for your annual wellness and preventative care visits.  This gives [place] a chance to discuss  healthy lifestyle, exercise, vaccinations, review your chart record, and perform screenings where appropriate.  I recommend you see your eye doctor yearly for routine vision care.  I recommend you see your dentist yearly for routine dental care including hygiene visits twice yearly.   Vaccination  Immunization History  Administered Date(s) Administered   [employer] Vaccination 10/27/2019, 11/24/2019, 08/11/2020   Tdap 04/02/2021    Vaccine recommendations: Yearly flu shot   Screening for cancer: Colon cancer screening: Age 45  Testicular cancer screening You should do a monthly self testicular exam if you are between 27-30 years old, and we typically do a testicular exam on the yearly physical for this same age group.   Prostate Cancer screening: The recommended prostate cancer screening test is a blood test called the prostate-specific antigen (PSA) test. PSA is a protein that is made in the prostate. As you age, your prostate naturally produces more PSA. Abnormally high PSA levels may be caused by: Prostate cancer. An enlarged prostate that is not caused by cancer (benign prostatic hyperplasia, or BPH). This condition is very common in older men. A prostate gland  infection (prostatitis) or urinary tract infection. Certain medicines such as male hormones (like testosterone) or other medicines that raise testosterone levels. A rectal exam may be done as part of prostate cancer screening to help provide information about the size of your prostate gland. When a rectal exam is performed, it should be done after the PSA level is drawn to avoid any effect on the results.   Skin cancer screening: Check your skin regularly for new changes, growing lesions, or other lesions of concern Come in for evaluation if you have skin lesions of concern.   Lung cancer screening: If you have a greater than 20 pack year history of tobacco use, then you may qualify for lung cancer  screening with a chest CT scan.   Please call your insurance company to inquire about coverage for this test.   Pancreatic cancer:  no current screening test is available or routinely recommended. (risk factors: smoking, overweight or obese, diabetes, chronic pancreatitis, work exposure - dry cleaning, metal working, 37yo>, M>F, Tree surgeon, family hx/o, hereditary breast, ovarian, melanoma, lynch, peutz-jeghers).  Symptoms: jaundice, dark urine, light color or greasy stools, itchy skin, belly or back pain, weight loss, poor appetite, nausea, vomiting, liver enlargement, DVT/blood clots.   We currently don't have screenings for other cancers besides breast, cervical, colon, and lung cancers.  If you have a strong family history of cancer or have other cancer screening concerns, please let me know.  Genetic testing referral is an option for individuals with high cancer risk in the family.  There are some other cancer screenings in development currently.   Bone health: Get at least 150 minutes of aerobic exercise weekly Get weight bearing exercise at least once weekly Bone density test:  A bone density test is an imaging test that uses a type of X-ray to measure the amount of calcium and other minerals in your bones. The test may be used to diagnose or screen you for a condition that causes weak or thin bones (osteoporosis), predict your risk for a broken bone (fracture), or determine how well your osteoporosis treatment is working. The bone density test is recommended for females 65 and older, or females or males <65 if certain risk factors such as thyroid disease, long term use of steroids such as for asthma or rheumatological issues, vitamin D deficiency, estrogen deficiency, family history of osteoporosis, self or family history of fragility fracture in first degree relative.    Heart health: Get at least 150 minutes of aerobic exercise weekly Limit alcohol It is important to maintain a  healthy blood pressure and healthy cholesterol numbers  Heart disease screening: Screening for heart disease includes screening for blood pressure, fasting lipids, glucose/diabetes screening, BMI height to weight ratio, reviewed of smoking status, physical activity, and diet.    Goals include blood pressure 120/80 or less, maintaining a healthy lipid/cholesterol profile, preventing diabetes or keeping diabetes numbers under good control, not smoking or using tobacco products, exercising most days per week or at least 150 minutes per week of exercise, and eating healthy variety of fruits and vegetables, healthy oils, and avoiding unhealthy food choices like fried food, fast food, high sugar and high cholesterol foods.    Other tests may possibly include EKG test, CT coronary calcium score, echocardiogram, exercise treadmill stress test.      Vascular disease screening: For higher risk individuals including smokers, diabetics, patients with known heart disease or high blood pressure, kidney disease, and others, screening for vascular disease or  atherosclerosis of the arteries is available.  Examples may include carotid ultrasound, abdominal aortic ultrasound, ABI blood flow screening in the legs, thoracic aorta screening.   Medical care options: I recommend you continue to seek care here first for routine care.  We try really hard to have available appointments Monday through Friday daytime hours for sick visits, acute visits, and physicals.  Urgent care should be used for after hours and weekends for significant issues that cannot wait till the next day.  The emergency department should be used for significant potentially life-threatening emergencies.  The emergency department is expensive, can often have long wait times for less significant concerns, so try to utilize primary care, urgent care, or telemedicine when possible to avoid unnecessary trips to the emergency department.  Virtual visits and  telemedicine have been introduced since the pandemic started in 2020, and can be convenient ways to receive medical care.  We offer virtual appointments as well to assist you in a variety of options to seek medical care.   Legal  Take the time to do a last will and testament, Advanced Directives including Health Care Power of Attorney and Living Will documents.  Don't leave your family with burdens that can be handled ahead of time.   Advanced Directives: I recommend you consider completing a Health Care Power of Attorney and Living Will.   These documents respect your wishes and help alleviate burdens on your loved ones if you were to become terminally ill or be in a position to need those documents enforced.    You can complete Advanced Directives yourself, have them notarized, then have copies made for our office, for you and for anybody you feel should have them in safe keeping.  Or, you can have an attorney prepare these documents.   If you haven't updated your Last Will and Testament in a while, it may be worthwhile having an attorney prepare these documents together and save on some costs.       Spiritual and Emotional Health Keeping a healthy spiritual life can help you better manage your physical health. Your spiritual life can help you to cope with any issues that may arise with your physical health.  Balance can keep [place] healthy and help [place] to recover.  If you are struggling with your spiritual health there are questions that you may want to ask yourself:  What makes me feel most complete? When do I feel most connected to the rest of the world? Where do I find the most inner strength? What am I doing when I feel whole?  Helpful tips: Being in nature. Some people feel very connected and at peace when they are walking outdoors or are outside. Helping others. Some feel the largest sense of wellbeing when they are of service to others. Being of service can take on many forms. It can  be doing volunteer work, being kind to strangers, or offering a hand to a friend in need. Gratitude. Some people find they feel the most connected when they remain grateful. They may make lists of all the things they are grateful for or say a thank you out loud for all they have.    Emotional Health Are you in tune with your emotional health?  Check out this link: http://www.marquez-love.com/    Financial Health Make sure you use a budget for your personal finances Make sure you are insured against risks (health insurance, life insurance, auto insurance, etc) Save more, spend less Set financial goals  If you need help in this area, good resources include counseling through [employer] or other community resources, have a meeting with a Social research officer, government, and a good resource is the [employer] was seen today for annual exam.  Diagnoses and all orders for this visit:  Encounter for health maintenance examination in adult -     POCT Urinalysis DIP (Proadvantage Device) -     Hepatic function panel -     Lipid panel -     Uric acid -     Hemoglobin A1c  Vaccine counseling  Mixed dyslipidemia -     Lipid panel  Impaired fasting blood sugar -     Hemoglobin A1c  History of kidney stones  Elevated uric acid in blood -     Uric acid    Follow-up pending labs, yearly for physical

## 2023-04-14 NOTE — Patient Instructions (Signed)
This visit was a preventative care visit, also known as wellness visit or routine physical.   Topics typically include healthy lifestyle, diet, exercise, preventative care, vaccinations, sick and well care, proper use of emergency dept and after hours care, as well as other concerns.     Separate significant issues discussed: Mixed dyslipidemia - labs today, compliant with lopid BID  Hx/o gout and elevated uric acid - compliant with allopurinol 100mg  daily   General Recommendations: Continue to return yearly for your annual wellness and preventative care visits.  This gives [place] a chance to discuss healthy lifestyle, exercise, vaccinations, review your chart record, and perform screenings where appropriate.  I recommend you see your eye doctor yearly for routine vision care.  I recommend you see your dentist yearly for routine dental care including hygiene visits twice yearly.   Vaccination  Immunization History  Administered Date(s) Administered   [employer] Vaccination 10/27/2019, 11/24/2019, 08/11/2020   Tdap 04/02/2021    Vaccine recommendations: Yearly flu shot   Screening for cancer: Colon cancer screening: Age 39  Testicular cancer screening You should do a monthly self testicular exam if you are between 39-66 years old, and we typically do a testicular exam on the yearly physical for this same age group.   Prostate Cancer screening: The recommended prostate cancer screening test is a blood test called the prostate-specific antigen (PSA) test. PSA is a protein that is made in the prostate. As you age, your prostate naturally produces more PSA. Abnormally high PSA levels may be caused by: Prostate cancer. An enlarged prostate that is not caused by cancer (benign prostatic hyperplasia, or BPH). This condition is very common in older men. A prostate gland infection (prostatitis) or urinary tract infection. Certain medicines such as male hormones (like  testosterone) or other medicines that raise testosterone levels. A rectal exam may be done as part of prostate cancer screening to help provide information about the size of your prostate gland. When a rectal exam is performed, it should be done after the PSA level is drawn to avoid any effect on the results.   Skin cancer screening: Check your skin regularly for new changes, growing lesions, or other lesions of concern Come in for evaluation if you have skin lesions of concern.   Lung cancer screening: If you have a greater than 20 pack year history of tobacco use, then you may qualify for lung cancer screening with a chest CT scan.   Please call your insurance company to inquire about coverage for this test.   Pancreatic cancer:  no current screening test is available or routinely recommended. (risk factors: smoking, overweight or obese, diabetes, chronic pancreatitis, work exposure - dry cleaning, metal working, 37yo>, M>F, Tree surgeon, family hx/o, hereditary breast, ovarian, melanoma, lynch, peutz-jeghers).  Symptoms: jaundice, dark urine, light color or greasy stools, itchy skin, belly or back pain, weight loss, poor appetite, nausea, vomiting, liver enlargement, DVT/blood clots.   We currently don't have screenings for other cancers besides breast, cervical, colon, and lung cancers.  If you have a strong family history of cancer or have other cancer screening concerns, please let me know.  Genetic testing referral is an option for individuals with high cancer risk in the family.  There are some other cancer screenings in development currently.   Bone health: Get at least 150 minutes of aerobic exercise weekly Get weight bearing exercise at least once weekly Bone density test:  A bone density test is an imaging test that  uses a type of X-ray to measure the amount of calcium and other minerals in your bones. The test may be used to diagnose or screen you for a condition that causes  weak or thin bones (osteoporosis), predict your risk for a broken bone (fracture), or determine how well your osteoporosis treatment is working. The bone density test is recommended for females 65 and older, or females or males <65 if certain risk factors such as thyroid disease, long term use of steroids such as for asthma or rheumatological issues, vitamin D deficiency, estrogen deficiency, family history of osteoporosis, self or family history of fragility fracture in first degree relative.    Heart health: Get at least 150 minutes of aerobic exercise weekly Limit alcohol It is important to maintain a healthy blood pressure and healthy cholesterol numbers  Heart disease screening: Screening for heart disease includes screening for blood pressure, fasting lipids, glucose/diabetes screening, BMI height to weight ratio, reviewed of smoking status, physical activity, and diet.    Goals include blood pressure 120/80 or less, maintaining a healthy lipid/cholesterol profile, preventing diabetes or keeping diabetes numbers under good control, not smoking or using tobacco products, exercising most days per week or at least 150 minutes per week of exercise, and eating healthy variety of fruits and vegetables, healthy oils, and avoiding unhealthy food choices like fried food, fast food, high sugar and high cholesterol foods.    Other tests may possibly include EKG test, CT coronary calcium score, echocardiogram, exercise treadmill stress test.      Vascular disease screening: For higher risk individuals including smokers, diabetics, patients with known heart disease or high blood pressure, kidney disease, and others, screening for vascular disease or atherosclerosis of the arteries is available.  Examples may include carotid ultrasound, abdominal aortic ultrasound, ABI blood flow screening in the legs, thoracic aorta screening.   Medical care options: I recommend you continue to seek care here first  for routine care.  We try really hard to have available appointments Monday through Friday daytime hours for sick visits, acute visits, and physicals.  Urgent care should be used for after hours and weekends for significant issues that cannot wait till the next day.  The emergency department should be used for significant potentially life-threatening emergencies.  The emergency department is expensive, can often have long wait times for less significant concerns, so try to utilize primary care, urgent care, or telemedicine when possible to avoid unnecessary trips to the emergency department.  Virtual visits and telemedicine have been introduced since the pandemic started in 2020, and can be convenient ways to receive medical care.  We offer virtual appointments as well to assist you in a variety of options to seek medical care.   Legal  Take the time to do a last will and testament, Advanced Directives including Health Care Power of Attorney and Living Will documents.  Don't leave your family with burdens that can be handled ahead of time.   Advanced Directives: I recommend you consider completing a Health Care Power of Attorney and Living Will.   These documents respect your wishes and help alleviate burdens on your loved ones if you were to become terminally ill or be in a position to need those documents enforced.    You can complete Advanced Directives yourself, have them notarized, then have copies made for our office, for you and for anybody you feel should have them in safe keeping.  Or, you can have an attorney prepare these documents.  If you haven't updated your Last Will and Testament in a while, it may be worthwhile having an attorney prepare these documents together and save on some costs.       Spiritual and Emotional Health Keeping a healthy spiritual life can help you better manage your physical health. Your spiritual life can help you to cope with any issues that may arise with your  physical health.  Balance can keep [place] healthy and help [place] to recover.  If you are struggling with your spiritual health there are questions that you may want to ask yourself:  What makes me feel most complete? When do I feel most connected to the rest of the world? Where do I find the most inner strength? What am I doing when I feel whole?  Helpful tips: Being in nature. Some people feel very connected and at peace when they are walking outdoors or are outside. Helping others. Some feel the largest sense of wellbeing when they are of service to others. Being of service can take on many forms. It can be doing volunteer work, being kind to strangers, or offering a hand to a friend in need. Gratitude. Some people find they feel the most connected when they remain grateful. They may make lists of all the things they are grateful for or say a thank you out loud for all they have.    Emotional Health Are you in tune with your emotional health?  Check out this link: http://www.marquez-love.com/    Financial Health Make sure you use a budget for your personal finances Make sure you are insured against risks (health insurance, life insurance, auto insurance, etc) Save more, spend less Set financial goals If you need help in this area, good resources include counseling through [employer] or other community resources, have a meeting with a Social research officer, government, and a good resource is the [employer]

## 2023-04-15 ENCOUNTER — Other Ambulatory Visit: Payer: Self-pay | Admitting: Medical

## 2023-04-15 LAB — LIPID PANEL
Chol/HDL Ratio: 6 ratio — ABNORMAL HIGH (ref 0.0–5.0)
Cholesterol, Total: 186 mg/dL (ref 100–199)
HDL: 31 mg/dL — ABNORMAL LOW (ref >39)
LDL Chol Calc (NIH): 129 mg/dL — ABNORMAL HIGH (ref 0–99)
Triglycerides: 141 mg/dL (ref 0–149)
VLDL Cholesterol Cal: 26 mg/dL (ref 5–40)

## 2023-04-15 LAB — HEPATIC FUNCTION PANEL
ALT: 21 IU/L (ref 0–44)
AST: 15 IU/L (ref 0–40)
Albumin: 5 g/dL (ref 4.1–5.1)
Alkaline Phosphatase: 77 IU/L (ref 44–121)
Bilirubin Total: 0.5 mg/dL (ref 0.0–1.2)
Bilirubin, Direct: 0.12 mg/dL (ref 0.00–0.40)
Total Protein: 7.6 g/dL (ref 6.0–8.5)

## 2023-04-15 LAB — URIC ACID: Uric Acid: 6.3 mg/dL (ref 3.8–8.4)

## 2023-04-15 LAB — HEMOGLOBIN A1C
Est. average glucose Bld gHb Est-mCnc: 117 mg/dL
Hgb A1c MFr Bld: 5.7 % — ABNORMAL HIGH (ref 4.8–5.6)

## 2023-04-15 MED ORDER — COLCHICINE 0.6 MG PO TABS: 0.6000 mg | ORAL_TABLET | Freq: Two times a day (BID) | ORAL | 1 refills | Status: DC

## 2023-04-15 MED ORDER — ALLOPURINOL 100 MG PO TABS: 100.0000 mg | ORAL_TABLET | Freq: Every day | ORAL | 3 refills | Status: DC

## 2023-04-15 MED ORDER — GEMFIBROZIL 600 MG PO TABS: 600.0000 mg | ORAL_TABLET | Freq: Two times a day (BID) | ORAL | 3 refills | Status: DC

## 2023-04-15 NOTE — Progress Notes (Signed)
Results sent through MyChart

## 2023-04-21 DIAGNOSIS — M5459 Other low back pain: Secondary | ICD-10-CM | POA: Diagnosis not present

## 2023-04-21 DIAGNOSIS — M542 Cervicalgia: Secondary | ICD-10-CM | POA: Diagnosis not present

## 2023-04-21 DIAGNOSIS — M6281 Muscle weakness (generalized): Secondary | ICD-10-CM | POA: Diagnosis not present

## 2023-05-06 DIAGNOSIS — M6281 Muscle weakness (generalized): Secondary | ICD-10-CM | POA: Diagnosis not present

## 2023-05-06 DIAGNOSIS — M542 Cervicalgia: Secondary | ICD-10-CM | POA: Diagnosis not present

## 2023-05-06 DIAGNOSIS — M5459 Other low back pain: Secondary | ICD-10-CM | POA: Diagnosis not present

## 2023-05-08 DIAGNOSIS — M6281 Muscle weakness (generalized): Secondary | ICD-10-CM | POA: Diagnosis not present

## 2023-05-08 DIAGNOSIS — M542 Cervicalgia: Secondary | ICD-10-CM | POA: Diagnosis not present

## 2023-05-08 DIAGNOSIS — M5459 Other low back pain: Secondary | ICD-10-CM | POA: Diagnosis not present

## 2023-05-13 DIAGNOSIS — M542 Cervicalgia: Secondary | ICD-10-CM | POA: Diagnosis not present

## 2023-05-13 DIAGNOSIS — M5459 Other low back pain: Secondary | ICD-10-CM | POA: Diagnosis not present

## 2023-05-13 DIAGNOSIS — M6281 Muscle weakness (generalized): Secondary | ICD-10-CM | POA: Diagnosis not present

## 2023-05-18 DIAGNOSIS — M6281 Muscle weakness (generalized): Secondary | ICD-10-CM | POA: Diagnosis not present

## 2023-05-18 DIAGNOSIS — M542 Cervicalgia: Secondary | ICD-10-CM | POA: Diagnosis not present

## 2023-05-18 DIAGNOSIS — M5459 Other low back pain: Secondary | ICD-10-CM | POA: Diagnosis not present

## 2023-05-20 DIAGNOSIS — M6281 Muscle weakness (generalized): Secondary | ICD-10-CM | POA: Diagnosis not present

## 2023-05-20 DIAGNOSIS — M5459 Other low back pain: Secondary | ICD-10-CM | POA: Diagnosis not present

## 2023-05-20 DIAGNOSIS — M542 Cervicalgia: Secondary | ICD-10-CM | POA: Diagnosis not present

## 2023-06-01 DIAGNOSIS — M542 Cervicalgia: Secondary | ICD-10-CM | POA: Diagnosis not present

## 2023-06-01 DIAGNOSIS — M5459 Other low back pain: Secondary | ICD-10-CM | POA: Diagnosis not present

## 2023-06-01 DIAGNOSIS — M6281 Muscle weakness (generalized): Secondary | ICD-10-CM | POA: Diagnosis not present

## 2023-06-08 DIAGNOSIS — M542 Cervicalgia: Secondary | ICD-10-CM | POA: Diagnosis not present

## 2023-06-08 DIAGNOSIS — M6281 Muscle weakness (generalized): Secondary | ICD-10-CM | POA: Diagnosis not present

## 2023-06-08 DIAGNOSIS — M5459 Other low back pain: Secondary | ICD-10-CM | POA: Diagnosis not present

## 2023-06-13 ENCOUNTER — Emergency Department (HOSPITAL_COMMUNITY)
Admission: EM | Admit: 2023-06-13 | Discharge: 2023-06-13 | Disposition: A | Discharge status: Home / Self Care | Payer: BC Managed Care – PPO | Attending: Emergency Medicine | Admitting: Emergency Medicine

## 2023-06-13 ENCOUNTER — Encounter (HOSPITAL_COMMUNITY): Payer: Self-pay

## 2023-06-13 ENCOUNTER — Emergency Department (HOSPITAL_COMMUNITY): Payer: BC Managed Care – PPO

## 2023-06-13 DIAGNOSIS — J0140 Acute pansinusitis, unspecified: Secondary | ICD-10-CM | POA: Diagnosis not present

## 2023-06-13 DIAGNOSIS — J329 Chronic sinusitis, unspecified: Secondary | ICD-10-CM | POA: Diagnosis not present

## 2023-06-13 DIAGNOSIS — R531 Weakness: Secondary | ICD-10-CM | POA: Diagnosis not present

## 2023-06-13 DIAGNOSIS — R29818 Other symptoms and signs involving the nervous system: Secondary | ICD-10-CM | POA: Diagnosis not present

## 2023-06-13 DIAGNOSIS — R42 Dizziness and giddiness: Secondary | ICD-10-CM | POA: Diagnosis not present

## 2023-06-13 DIAGNOSIS — J0141 Acute recurrent pansinusitis: Secondary | ICD-10-CM | POA: Diagnosis not present

## 2023-06-13 LAB — COMPREHENSIVE METABOLIC PANEL
ALT: 23 U/L (ref 0–44)
AST: 17 U/L (ref 15–41)
Albumin: 4.6 g/dL (ref 3.5–5.0)
Alkaline Phosphatase: 58 U/L (ref 38–126)
Anion gap: 8 (ref 5–15)
BUN: 14 mg/dL (ref 6–20)
CO2: 26 mmol/L (ref 22–32)
Calcium: 9.4 mg/dL (ref 8.9–10.3)
Chloride: 102 mmol/L (ref 98–111)
Creatinine, Ser: 0.87 mg/dL (ref 0.61–1.24)
GFR, Estimated: >60 mL/min (ref >60)
Glucose, Bld: 109 mg/dL — ABNORMAL HIGH (ref 70–99)
Potassium: 4.1 mmol/L (ref 3.5–5.1)
Sodium: 136 mmol/L (ref 135–145)
Total Bilirubin: 0.9 mg/dL (ref 0.3–1.2)
Total Protein: 7.7 g/dL (ref 6.5–8.1)

## 2023-06-13 LAB — DIFFERENTIAL
Abs Immature Granulocytes: 0.01 10*3/uL (ref 0.00–0.07)
Basophils Absolute: 0 10*3/uL (ref 0.0–0.1)
Basophils Relative: 0 %
Eosinophils Absolute: 0 10*3/uL (ref 0.0–0.5)
Eosinophils Relative: 0 %
Immature Granulocytes: 0 %
Lymphocytes Relative: 25 %
Lymphs Abs: 1.2 10*3/uL (ref 0.7–4.0)
Monocytes Absolute: 0.3 10*3/uL (ref 0.1–1.0)
Monocytes Relative: 6 %
Neutro Abs: 3.3 10*3/uL (ref 1.7–7.7)
Neutrophils Relative %: 69 %

## 2023-06-13 LAB — I-STAT CHEM 8, ED
BUN: 16 mg/dL (ref 6–20)
Calcium, Ion: 1.29 mmol/L (ref 1.15–1.40)
Chloride: 102 mmol/L (ref 98–111)
Creatinine, Ser: 0.9 mg/dL (ref 0.61–1.24)
Glucose, Bld: 102 mg/dL — ABNORMAL HIGH (ref 70–99)
HCT: 45 % (ref 39.0–52.0)
Hemoglobin: 15.3 g/dL (ref 13.0–17.0)
Potassium: 4.1 mmol/L (ref 3.5–5.1)
Sodium: 140 mmol/L (ref 135–145)
TCO2: 26 mmol/L (ref 22–32)

## 2023-06-13 LAB — CBC
HCT: 44.6 % (ref 39.0–52.0)
Hemoglobin: 15.1 g/dL (ref 13.0–17.0)
MCH: 30 pg (ref 26.0–34.0)
MCHC: 33.9 g/dL (ref 30.0–36.0)
MCV: 88.5 fL (ref 80.0–100.0)
Platelets: 300 10*3/uL (ref 150–400)
RBC: 5.04 MIL/uL (ref 4.22–5.81)
RDW: 12.2 % (ref 11.5–15.5)
WBC: 4.9 10*3/uL (ref 4.0–10.5)
nRBC: 0 % (ref 0.0–0.2)

## 2023-06-13 LAB — RAPID URINE DRUG SCREEN, HOSP PERFORMED
Amphetamines: NOT DETECTED
Barbiturates: NOT DETECTED
Benzodiazepines: NOT DETECTED
Cocaine: NOT DETECTED
Opiates: NOT DETECTED
Tetrahydrocannabinol: NOT DETECTED

## 2023-06-13 LAB — URINALYSIS, ROUTINE W REFLEX MICROSCOPIC
Bilirubin Urine: NEGATIVE
Glucose, UA: NEGATIVE mg/dL
Hgb urine dipstick: NEGATIVE
Ketones, ur: NEGATIVE mg/dL
Leukocytes,Ua: NEGATIVE
Nitrite: NEGATIVE
Protein, ur: NEGATIVE mg/dL
Specific Gravity, Urine: 1.006 (ref 1.005–1.030)
pH: 8 (ref 5.0–8.0)

## 2023-06-13 LAB — PROTIME-INR
INR: 1.1 (ref 0.8–1.2)
Prothrombin Time: 14.5 s (ref 11.4–15.2)

## 2023-06-13 LAB — CBG MONITORING, ED: Glucose-Capillary: 93 mg/dL (ref 70–99)

## 2023-06-13 LAB — APTT: aPTT: 29 s (ref 24–36)

## 2023-06-13 LAB — ETHANOL: Alcohol, Ethyl (B): <10 mg/dL (ref <10)

## 2023-06-13 MED ORDER — MECLIZINE HCL 25 MG PO TABS
25.0000 mg | ORAL_TABLET | Freq: Once | ORAL | Status: AC
  Administered 2023-06-13: 25 mg via ORAL
  Filled 2023-06-13: qty 1

## 2023-06-13 MED ORDER — MECLIZINE HCL 25 MG PO TABS: 25.0000 mg | ORAL_TABLET | Freq: Three times a day (TID) | ORAL | 0 refills | Status: DC | PRN

## 2023-06-13 MED ORDER — AMOXICILLIN 500 MG PO CAPS: ORAL_CAPSULE | ORAL | 0 refills | Status: DC

## 2023-06-13 MED ORDER — DIPHENHYDRAMINE HCL 50 MG/ML IJ SOLN
25.0000 mg | Freq: Once | INTRAMUSCULAR | Status: AC
  Administered 2023-06-13: 25 mg via INTRAVENOUS
  Filled 2023-06-13: qty 1

## 2023-06-13 MED ORDER — METOCLOPRAMIDE HCL 5 MG/ML IJ SOLN
10.0000 mg | Freq: Once | INTRAMUSCULAR | Status: AC
  Administered 2023-06-13: 10 mg via INTRAVENOUS
  Filled 2023-06-13: qty 2

## 2023-06-13 MED ORDER — IOHEXOL 350 MG/ML SOLN
75.0000 mL | Freq: Once | INTRAVENOUS | Status: AC | PRN
Start: 2023-06-13 — End: 2023-06-13
  Administered 2023-06-13: 75 mL via INTRAVENOUS

## 2023-06-13 NOTE — ED Provider Notes (Signed)
Patient's vertigo improved with Antivert.  MRI shows pansinusitis.  He will be started on amoxicillin and Antivert and referred to ENT   Bethann Berkshire, MD 06/13/23 2114

## 2023-06-13 NOTE — Discharge Instructions (Signed)
Follow-up with Dr. Irene Pap in 2-4 weeks

## 2023-06-13 NOTE — ED Triage Notes (Signed)
Pt has been feeling weakness since Tuesday. Pt woke up today at 0800 and had dizziness and not feeling well. When assessing strength and sensation pt does endorse increased weakness on left side along with decreased sensation. Droop was when trying to hold the left leg up.

## 2023-06-13 NOTE — ED Provider Notes (Signed)
EMERGENCY DEPARTMENT AT [facility] Provider Note   CSN: [id number] Arrival date & time: 06/13/23  1227     History  Chief Complaint  Patient presents with   Dizziness    Daniel Wang is a 37 y.o. male.  HPI 37 year old male presents headache and dizziness.  History is taken through the Spanish interpreter.  The patient has been having dizziness and left sided tingling/numbness since 10/15.  He is also had a little bit of blurry vision.  He thought the left-sided tingling/numbness might be related to his chronic herniated disc in his low back.  He got dry needling done in his low back on 10/14.  However this morning he woke up and he was having a significant headache that has progressively worsened.  He has not had a fever or neck stiffness though his neck does hurt.  The headache is primarily left-sided at this point.  He feels like he is having some problems with his speech as well, mostly getting the right words out and understanding speech.  Symptoms acutely worsened when he woke up at 8 AM.  Home Medications Prior to Admission medications   Medication Sig Start Date End Date Taking? Authorizing Provider  acetaminophen (TYLENOL) 500 MG tablet Take 1,000 mg by mouth daily as needed for moderate pain (pain score 4-6). 12/22/20  Yes [provider]  colchicine 0.6 MG tablet Take 1 tablet (0.6 mg total) by mouth 2 (two) times daily. X 5-7 days for flareup Patient taking differently: Take 0.6 mg by mouth 2 (two) times daily as needed (gout flare up). X 5-7 days for flareup 04/15/23  Yes Tysinger, Kermit Balo, PA-C  gemfibrozil (LOPID) 600 MG tablet Take 1 tablet (600 mg total) by mouth 2 (two) times daily before a meal. 04/15/23  Yes Tysinger, Kermit Balo, PA-C  allopurinol (ZYLOPRIM) 100 MG tablet Take 1 tablet (100 mg total) by mouth daily. Patient not taking: Reported on 06/13/2023 04/15/23 04/14/24  Tysinger, Kermit Balo, PA-C      Allergies    Patient has  no known allergies.    Review of Systems   Review of Systems  Constitutional:  Negative for fever.  Eyes:  Positive for visual disturbance (blurry, not double).  Gastrointestinal:  Positive for nausea. Negative for abdominal pain and vomiting.  Musculoskeletal:  Positive for neck pain. Negative for neck stiffness.  Neurological:  Positive for dizziness, weakness, numbness and headaches.    Physical Exam Updated Vital Signs BP 120/81 (BP Location: Left Arm)   Pulse 66   Temp 97.6 F (36.4 C) (Oral)   Resp 12   Ht 5\' 4"  (1.626 m)   Wt 70.3 kg   SpO2 99%   BMI 26.61 kg/m  Physical Exam Vitals and nursing note reviewed.  Constitutional:      General: He is not in acute distress.    Appearance: He is well-developed. He is not ill-appearing or diaphoretic.  HENT:     Head: Normocephalic and atraumatic.  Eyes:     Extraocular Movements: Extraocular movements intact.     Pupils: Pupils are equal, round, and reactive to light.  Cardiovascular:     Rate and Rhythm: Normal rate and regular rhythm.     Heart sounds: Normal heart sounds.  Pulmonary:     Effort: Pulmonary effort is normal.     Breath sounds: Normal breath sounds.  Abdominal:     Palpations: Abdomen is soft.     Tenderness: There  is no abdominal tenderness.  Musculoskeletal:     Cervical back: Normal range of motion. No rigidity.  Skin:    General: Skin is warm and dry.  Neurological:     Mental Status: He is alert.     Comments: CN 3-12 grossly intact save for subjective decreased sensation upon left face. 5/5 strength in all 4 extremities, though mildly weaker  left upper/lower extremities. Normal finger to nose.  Subjectively decreased sensation in left face, hand, leg.     ED Results / Procedures / Treatments   Labs (all labs ordered are listed, but only abnormal results are displayed) Labs Reviewed  COMPREHENSIVE METABOLIC PANEL - Abnormal; Notable for the following components:      Result Value    Glucose, Bld 109 (*)    All other components within normal limits  URINALYSIS, ROUTINE W REFLEX MICROSCOPIC - Abnormal; Notable for the following components:   Color, Urine STRAW (*)    All other components within normal limits  I-STAT CHEM 8, ED - Abnormal; Notable for the following components:   Glucose, Bld 102 (*)    All other components within normal limits  ETHANOL  PROTIME-INR  APTT  CBC  DIFFERENTIAL  RAPID URINE DRUG SCREEN, HOSP PERFORMED  CBG MONITORING, ED    EKG EKG Interpretation Date/Time:  Saturday June 13 2023 14:33:22 EDT Ventricular Rate:  67 PR Interval:  142 QRS Duration:  96 QT Interval:  384 QTC Calculation: 406 R Axis:   78  Text Interpretation: Sinus rhythm no acute ST/T changes similar to earlier in the day Confirmed by Pricilla Loveless [phone]) on 06/13/2023 2:45:11 PM  Radiology No results found.  Procedures Procedures    Medications Ordered in ED Medications  metoCLOPramide (REGLAN) injection 10 mg (has no administration in time range)  diphenhydrAMINE (BENADRYL) injection 25 mg (has no administration in time range)  iohexol (OMNIPAQUE) 350 MG/ML injection 75 mL (75 mLs Intravenous Contrast Given 06/13/23 1453)    ED Course/ Medical Decision Making/ A&P                                 Medical Decision Making Amount and/or Complexity of Data Reviewed Labs: ordered. Radiology: ordered.  Risk Prescription drug management.   Patient presents with headache, dizziness, weakness.  Last known well was multiple days ago.  Now having progressively worsening headache but no thunderclap headache.  CT head and CT angiography will be obtained.  If this is negative we will probably need MRI.  He was given a headache cocktail. Care transferred to Dr. Estell Harpin.       Final Clinical Impression(s) / ED Diagnoses Final diagnoses:  None    Rx / DC Orders ED Discharge Orders     None         Pricilla Loveless, MD 06/13/23 1513

## 2024-06-30 ENCOUNTER — Encounter: Payer: Self-pay | Admitting: Neurology

## 2024-06-30 ENCOUNTER — Ambulatory Visit (INDEPENDENT_AMBULATORY_CARE_PROVIDER_SITE_OTHER): Admitting: Medical

## 2024-06-30 ENCOUNTER — Encounter: Payer: Self-pay | Admitting: Medical

## 2024-06-30 VITALS — BP 108/68 | HR 76 | Ht 62.0 in | Wt 156.6 lb

## 2024-06-30 DIAGNOSIS — R0789 Other chest pain: Secondary | ICD-10-CM

## 2024-06-30 DIAGNOSIS — Z87442 Personal history of urinary calculi: Secondary | ICD-10-CM

## 2024-06-30 DIAGNOSIS — R202 Paresthesia of skin: Secondary | ICD-10-CM

## 2024-06-30 DIAGNOSIS — E782 Mixed hyperlipidemia: Secondary | ICD-10-CM

## 2024-06-30 DIAGNOSIS — Z0000 Encounter for general adult medical examination without abnormal findings: Secondary | ICD-10-CM

## 2024-06-30 DIAGNOSIS — R7301 Impaired fasting glucose: Secondary | ICD-10-CM

## 2024-06-30 DIAGNOSIS — Z13220 Encounter for screening for lipoid disorders: Secondary | ICD-10-CM | POA: Diagnosis not present

## 2024-06-30 DIAGNOSIS — Z8042 Family history of malignant neoplasm of prostate: Secondary | ICD-10-CM

## 2024-06-30 DIAGNOSIS — Z23 Encounter for immunization: Secondary | ICD-10-CM

## 2024-06-30 LAB — LIPID PANEL

## 2024-06-30 NOTE — Progress Notes (Signed)
 Subjective: Chief Complaint  Patient presents with   Annual Exam    Fasting cpe, numbness/tingling on arm and leg on left side for the past 3 days.    Daniel Wang is a 38 year old male who presents with symptoms of numbness and chest pressure.  He experiences numbness primarily on the left side of his body, including his arm, along with tingling sensations. There is a sensation of pressure on the left side of his chest, accompanied by a burning feeling. These symptoms are not painful but are concerning to him. When sitting, he experiences numbness and a sensation akin to 'bubbles' in the blood flow, particularly when crossing his legs for extended periods. He is uncertain if the numbness in his arm is related to his neck.  He recalls experiencing vertigo episodes around October of the previous year, which were attributed to a severe sinus infection. During that time, he underwent an MRI of his brain and head.  He is currently taking allopurinol  and has colchicine  available at home. He is not taking gemfibrozil  for cholesterol management prescribed last year.  He engages in physical activity by walking for about thirty minutes, four to five times a week, usually after dinner with his wife.   Family history is notable for his father being diagnosed with prostate cancer at the age of 17 or 38.    No Known Allergies  Past Medical History:  Diagnosis Date   GERD (gastroesophageal reflux disease)    Headache    chronic as of 10/2020   History of kidney stones    Impaired fasting blood sugar 07/17/22   Mixed dyslipidemia Jul 17, 2022   Wears glasses     Current Outpatient Medications on File Prior to Visit  Medication Sig Dispense Refill   acetaminophen  (TYLENOL ) 500 MG tablet Take 1,000 mg by mouth daily as needed for moderate pain (pain score 4-6).     allopurinol  (ZYLOPRIM ) 100 MG tablet Take 1 tablet (100 mg total) by mouth daily. 90 tablet 3   colchicine  0.6 MG tablet Take 1 tablet  (0.6 mg total) by mouth 2 (two) times daily. X 5-7 days for flareup 30 tablet 1   gemfibrozil  (LOPID ) 600 MG tablet Take 1 tablet (600 mg total) by mouth 2 (two) times daily before a meal. (Patient not taking: Reported on 06/30/2024) 180 tablet 3   No current facility-administered medications on file prior to visit.      Current Outpatient Medications:    acetaminophen  (TYLENOL ) 500 MG tablet, Take 1,000 mg by mouth daily as needed for moderate pain (pain score 4-6)., Disp: , Rfl:    allopurinol  (ZYLOPRIM ) 100 MG tablet, Take 1 tablet (100 mg total) by mouth daily., Disp: 90 tablet, Rfl: 3   colchicine  0.6 MG tablet, Take 1 tablet (0.6 mg total) by mouth 2 (two) times daily. X 5-7 days for flareup, Disp: 30 tablet, Rfl: 1   gemfibrozil  (LOPID ) 600 MG tablet, Take 1 tablet (600 mg total) by mouth 2 (two) times daily before a meal. (Patient not taking: Reported on 06/30/2024), Disp: 180 tablet, Rfl: 3  Family History  Problem Relation Age of Onset   Hypertension Mother    Hyperlipidemia Mother    Thyroid  disease Mother    Cancer Father 64       prostate   Diabetes Sister    Other Brother        died 2021/07/17 in MVA   Other Brother        congenital  Other Maternal Aunt     Past Surgical History:  Procedure Laterality Date   CYSTOSCOPY/URETEROSCOPY/HOLMIUM LASER Right 05/22/2017   Procedure: CYSTOSCOPY/RIGHT URETEROSCOPY/RIGHT RETROGRADE PYELOGRAM WITH STENT PLACEMENT;  Surgeon: Carolee Sherwood JONETTA DOUGLAS, MD;  Location: [facility];  Service: Urology;  Laterality: Right;     ROS as in subjective   Objective: BP 108/68   Pulse 76   Ht 5' 2 (1.575 m)   Wt 156 lb 9.6 oz (71 kg)   SpO2 98%   BMI 28.64 kg/m     General appearence: alert, no distress, WD/WN  HEENT: normocephalic, sclerae anicteric, PERRLA, EOMi Neck: supple, no lymphadenopathy, no thyromegaly, no masses, no bruits Heart: RRR, normal S1, S2, no murmurs Lungs: CTA bilaterally, no wheezes, rhonchi, or rales Abdomen: +bs,  soft, mild epigastric and left side tendnerss, othewrise non tender, non distended, no masses, no hepatomegaly, no splenomegaly Back: non tender, normal ROM Musculoskeletal: UE and LE nontender, no swelling, no obvious deformity Extremities: no edema, no cyanosis, no clubbing Pulses: 2+ symmetric, upper and lower extremities, normal cap refill Neurological: alert, oriented x 3, CN2-12 intact, strength normal upper extremities and lower extremities, sensation normal throughout, DTRs 2+ throughout, no cerebellar signs, gait normal Psychiatric: normal affect, behavior normal, pleasant     EKG reviewed Normal.  I suspect lead placement issue on 2024 EKG at ED as prior and current EKG is normal  Assessment: Encounter Diagnoses  Name Primary?   Encounter for health maintenance examination in adult Yes   Needs flu shot    Family history of prostate cancer    Screening for lipid disorders    Mixed dyslipidemia    Impaired fasting blood sugar    History of kidney stones    Paresthesia    Chest pressure      Plan: This visit was a preventative care visit, also known as wellness visit or routine physical.   Topics typically include healthy lifestyle, diet, exercise, preventative care, vaccinations, sick and well care, proper use of emergency dept and after hours care, as well as other concerns.     Recommendations: Continue to return yearly for your annual wellness and preventative care visits.  This gives us  a chance to discuss healthy lifestyle, exercise, vaccinations, review your chart record, and perform screenings where appropriate.  I recommend you see your eye doctor yearly for routine vision care.  I recommend you see your dentist yearly for routine dental care including hygiene visits twice yearly.   Vaccination recommendations were reviewed Immunization History  Administered Date(s) Administered   Influenza, Seasonal, Injecte, Preservative Fre 06/30/2024   Moderna  Sars-Covid-2 Vaccination 10/27/2019, 11/24/2019, 08/11/2020   Tdap 04/02/2021   Counseled on the influenza virus vaccine.   Influenza vaccine given after consent obtained.    Screening for cancer: Colon cancer screening: Age 4   Testicular cancer screening You should do a monthly self testicular exam if you are between 62-36 years old  We discussed PSA, prostate exam, and prostate cancer screening risks/benefits.     Skin cancer screening: Check your skin regularly for new changes, growing lesions, or other lesions of concern Come in for evaluation if you have skin lesions of concern.  Lung cancer screening: If you have a greater than 20 pack year history of tobacco use, then you may qualify for lung cancer screening with a chest CT scan.   Please call your insurance company to inquire about coverage for this test.  We currently don't have screenings for other cancers  besides breast, cervical, colon, and lung cancers.  If you have a strong family history of cancer or have other cancer screening concerns, please let me know.    Bone health: Get at least 150 minutes of aerobic exercise weekly Get weight bearing exercise at least once weekly Bone density test:  A bone density test is an imaging test that uses a type of X-ray to measure the amount of calcium and other minerals in your bones. The test may be used to diagnose or screen you for a condition that causes weak or thin bones (osteoporosis), predict your risk for a broken bone (fracture), or determine how well your osteoporosis treatment is working. The bone density test is recommended for females 65 and older, or females or males <65 if certain risk factors such as thyroid  disease, long term use of steroids such as for asthma or rheumatological issues, vitamin D deficiency, estrogen deficiency, family history of osteoporosis, self or family history of fragility fracture in first degree relative.    Heart health: Get at  least 150 minutes of aerobic exercise weekly Limit alcohol It is important to maintain a healthy blood pressure and healthy cholesterol numbers  Heart disease screening: Screening for heart disease includes screening for blood pressure, fasting lipids, glucose/diabetes screening, BMI height to weight ratio, reviewed of smoking status, physical activity, and diet.    Goals include blood pressure 120/80 or less, maintaining a healthy lipid/cholesterol profile, preventing diabetes or keeping diabetes numbers under good control, not smoking or using tobacco products, exercising most days per week or at least 150 minutes per week of exercise, and eating healthy variety of fruits and vegetables, healthy oils, and avoiding unhealthy food choices like fried food, fast food, high sugar and high cholesterol foods.    Other tests may possibly include EKG test, CT coronary calcium score, echocardiogram, exercise treadmill stress test.    Medical care options: I recommend you continue to seek care here first for routine care.  We try really hard to have available appointments Monday through Friday daytime hours for sick visits, acute visits, and physicals.  Urgent care should be used for after hours and weekends for significant issues that cannot wait till the next day.  The emergency department should be used for significant potentially life-threatening emergencies.  The emergency department is expensive, can often have long wait times for less significant concerns, so try to utilize primary care, urgent care, or telemedicine when possible to avoid unnecessary trips to the emergency department.  Virtual visits and telemedicine have been introduced since the pandemic started in 2020, and can be convenient ways to receive medical care.  We offer virtual appointments as well to assist you in a variety of options to seek medical care.    Separate significant issues discussed: We discussed his unusual left-sided  numbness symptoms.  He has had this prior.  He has had brain imaging as well as spinal imaging in recent years with no obvious cause.  He has not had nerve conduction studies or neurology consult.  He was referred to neurology in 2022 but never went.  He went to physical therapy for 2 months last year given some similar symptoms in the legs but that did not really help his symptoms at all.  Assuming labs are okay we will plan to refer for likely neurology, possibly orthopedics  Dyslipidemia-not currently on gemfibrozil  recommended last year.  Updated labs today.  History of uric acid stones-continue allopurinol  for prevention  Izaya was seen today for annual exam.  Diagnoses and all orders for this visit:  Encounter for health maintenance examination in adult -     CBC -     Comprehensive metabolic panel with GFR -     Lipid panel -     PSA -     Hemoglobin A1c -     Vitamin B12 -     EKG 12-Lead -     CK -     Sedimentation rate -     ANA  Needs flu shot -     Flu vaccine trivalent PF, 6mos and older(Flulaval,Afluria,Fluarix,Fluzone)  Family history of prostate cancer -     PSA  Screening for lipid disorders  Mixed dyslipidemia -     Lipid panel  Impaired fasting blood sugar -     Hemoglobin A1c  History of kidney stones  Paresthesia -     Vitamin B12 -     CK -     Sedimentation rate -     ANA  Chest pressure -     EKG 12-Lead   F/u pending labs

## 2024-07-01 ENCOUNTER — Ambulatory Visit: Payer: Self-pay | Admitting: Medical

## 2024-07-01 ENCOUNTER — Other Ambulatory Visit: Payer: Self-pay | Admitting: Medical

## 2024-07-01 LAB — COMPREHENSIVE METABOLIC PANEL WITH GFR
ALT: 20 IU/L (ref 0–44)
AST: 18 IU/L (ref 0–40)
Albumin: 4.9 g/dL (ref 4.1–5.1)
Alkaline Phosphatase: 79 IU/L (ref 47–123)
BUN/Creatinine Ratio: 13 (ref 9–20)
BUN: 13 mg/dL (ref 6–24)
Bilirubin Total: 1 mg/dL (ref 0.0–1.2)
CO2: 23 mmol/L (ref 20–29)
Calcium: 10.1 mg/dL (ref 8.7–10.2)
Chloride: 103 mmol/L (ref 96–106)
Creatinine, Ser: 0.99 mg/dL (ref 0.76–1.27)
Globulin, Total: 3 g/dL (ref 1.5–4.5)
Glucose: 94 mg/dL (ref 70–99)
Potassium: 4.7 mmol/L (ref 3.5–5.2)
Sodium: 141 mmol/L (ref 134–144)
Total Protein: 7.9 g/dL (ref 6.0–8.5)
eGFR: 99 mL/min/1.73 (ref >59)

## 2024-07-01 LAB — HEMOGLOBIN A1C
Est. average glucose Bld gHb Est-mCnc: 117 mg/dL
Hgb A1c MFr Bld: 5.7 % — AB (ref 4.8–5.6)

## 2024-07-01 LAB — ANA

## 2024-07-01 LAB — LIPID PANEL
Cholesterol, Total: 191 mg/dL (ref 100–199)
HDL: 31 mg/dL — AB (ref >39)
LDL CALC COMMENT:: 6.2 ratio — AB (ref 0.0–5.0)
LDL Chol Calc (NIH): 122 mg/dL — AB (ref 0–99)
Triglycerides: 214 mg/dL — AB (ref 0–149)
VLDL Cholesterol Cal: 38 mg/dL (ref 5–40)

## 2024-07-01 LAB — CBC
Hematocrit: 47 % (ref 37.5–51.0)
Hemoglobin: 15.4 g/dL (ref 13.0–17.7)
MCH: 29.3 pg (ref 26.6–33.0)
MCHC: 32.8 g/dL (ref 31.5–35.7)
MCV: 90 fL (ref 79–97)
Platelets: 319 x10E3/uL (ref 150–450)
RBC: 5.25 x10E6/uL (ref 4.14–5.80)
RDW: 12.9 % (ref 11.6–15.4)
WBC: 4.4 x10E3/uL (ref 3.4–10.8)

## 2024-07-01 LAB — VITAMIN B12: Vitamin B-12: 658 pg/mL (ref 232–1245)

## 2024-07-01 LAB — PSA: Prostate Specific Ag, Serum: 0.3 ng/mL (ref 0.0–4.0)

## 2024-07-01 LAB — CK: Total CK: 66 U/L (ref 49–439)

## 2024-07-01 LAB — SEDIMENTATION RATE: Sed Rate: 2 mm/h (ref 0–15)

## 2024-07-01 MED ORDER — GEMFIBROZIL 600 MG PO TABS: 600.0000 mg | ORAL_TABLET | Freq: Two times a day (BID) | ORAL | 3 refills | Status: AC

## 2024-07-01 MED ORDER — COLCHICINE 0.6 MG PO TABS: 0.6000 mg | ORAL_TABLET | Freq: Two times a day (BID) | ORAL | 1 refills | Status: AC

## 2024-07-01 MED ORDER — ALLOPURINOL 100 MG PO TABS: 100.0000 mg | ORAL_TABLET | Freq: Every day | ORAL | 3 refills | Status: AC

## 2024-07-01 NOTE — Progress Notes (Signed)
 Results to MyChart

## 2024-07-01 NOTE — Progress Notes (Signed)
 Results through MyChart

## 2024-07-28 ENCOUNTER — Encounter: Payer: Self-pay | Admitting: Medical

## 2024-09-05 ENCOUNTER — Ambulatory Visit (INDEPENDENT_AMBULATORY_CARE_PROVIDER_SITE_OTHER): Admitting: Neurology

## 2024-09-05 ENCOUNTER — Encounter: Payer: Self-pay | Admitting: Neurology

## 2024-09-05 VITALS — BP 129/86 | HR 90 | Ht 62.0 in | Wt 157.0 lb

## 2024-09-05 DIAGNOSIS — R202 Paresthesia of skin: Secondary | ICD-10-CM | POA: Diagnosis not present

## 2024-09-05 DIAGNOSIS — R200 Anesthesia of skin: Secondary | ICD-10-CM

## 2024-09-05 DIAGNOSIS — R292 Abnormal reflex: Secondary | ICD-10-CM

## 2024-09-05 NOTE — Progress Notes (Signed)
 [facility] Clinic Note - Initial Visit   Date: 09/05/2024   Daniel Wang MRN: [id number] DOB: 07-03-1986   Dear Daniel Gent, PA-C:  Thank you for your kind referral of Daniel Wang for consultation of paresthesia. Although his history is well known to you, please allow us  to reiterate it for the purpose of our medical record. The patient was accompanied to the clinic by self.   Daniel Wang is a 39 y.o. right-handed male with gout and hyperlipidemia presenting for evaluation of paresthesia.   IMPRESSION/PLAN: Assessment & Plan Diffuse paresthesias over the lateral left lower extremity (thigh, lower leg, foot), with similar symptoms in the left arm and right leg.  Prior testing includes MRI brain, MRI thoracic spine, and MRI lumbar spine which does not show any pathology to explain symptoms.  TSH and vitamin B12 is also normal.  Neurological exam shows mildly brisk reflexes at the knees, otherwise normal strength and sensation.  Symptoms too diffuse for meralgia paresthetica. No evidence of intracranial pathology, such as demyelinating disease, on MRI brain.  To further investigate symptoms: - Order nerve conduction study and electromyography for left arm and leg. - MRI of cervical spine wwo contrast  Further recommendations pending results.  ------------------------------------------------------------- History of present illness: Discussed the use of AI scribe software for clinical note transcription with the patient, who gave verbal consent to proceed.  History of Present Illness Daniel Wang is a 39 year old male who presents with numbness and tingling in the extremities.   He has been experiencing numbness and tingling for several months, initially presenting as a burning sensation on the left thigh. Over time, the symptoms have progressed to include numbness that can occur suddenly, affecting his ability to sit or  stand for prolonged periods due to burning and numbness occurring simultaneously. The numbness extends past the knee into the leg. He notes the presence of freckles on the area where the burning sensation started. The symptoms have also begun to affect the right side, though not as severely as the left.  He experiences chest pressure and occasional numbness or tiredness in the left hand and arm, with similar but less severe symptoms on the right side. Walking exacerbates the pain and burning, limiting his ability to walk for more than ten minutes before needing to rest. No cramps in the legs are reported.  He has undergone extensive testing, including an MRI of the brain, blood vessel imaging of the head and neck, and MRIs thoracic and lumbar spine.  He has not identified any specific triggers for his symptoms and denies any recent medication changes, injuries, infections, or new stressors.  His social history includes working as a librarian, academic in a sedentary job, though he uses a standing desk to alternate between sitting and standing. He does not smoke and drinks alcohol occasionally, mainly during holidays.   Out-side paper records, electronic medical record, and images have been reviewed where available and summarized as:  MRI brain wo contrast 06/13/2023: 1. No evidence of acute intracranial abnormality. 2. Chronic left ostiomeatal complex obstructive pattern paranasal sinus disease.  CTA head and neck wwo contrast 06/13/2023: 1. Normal CT appearance of the brain. 2. Normal CTA of the neck. 3. Normal CTA Circle of Willis without significant proximal stenosis, aneurysm, or branch vessel occlusion. 4. Chronic left anterior paranasal sinus disease.  MRI thoracic and lumbar spine wo contrast 03/11/2023: 1. No spinal canal stenosis or neural foraminal narrowing in the thoracic  or lumbar spine. 2. L5-S1 small central disc protrusion with annular fissure, which can be painful.   Lab Results   Component Value Date   HGBA1C 5.7 (H) 06/30/2024   Lab Results  Component Value Date   VITAMINB12 658 06/30/2024   Lab Results  Component Value Date   TSH 1.070 01/29/2023   Lab Results  Component Value Date   ESRSEDRATE 2 06/30/2024    Past Medical History:  Diagnosis Date   GERD (gastroesophageal reflux disease)    Headache    chronic as of 10/2020   History of kidney stones    Impaired fasting blood sugar September 26, 2021   Mixed dyslipidemia 09/26/21   Wears glasses     Past Surgical History:  Procedure Laterality Date   CYSTOSCOPY/URETEROSCOPY/HOLMIUM LASER Right 05/22/2017   Procedure: CYSTOSCOPY/RIGHT URETEROSCOPY/RIGHT RETROGRADE PYELOGRAM WITH STENT PLACEMENT;  Surgeon: Daniel Sherwood JONETTA DOUGLAS, MD;  Location: [facility];  Service: Urology;  Laterality: Right;     Medications:  Outpatient Encounter Medications as of 09/05/2024  Medication Sig   acetaminophen  (TYLENOL ) 500 MG tablet Take 1,000 mg by mouth daily as needed for moderate pain (pain score 4-6).   allopurinol  (ZYLOPRIM ) 100 MG tablet Take 1 tablet (100 mg total) by mouth daily.   colchicine  0.6 MG tablet Take 1 tablet (0.6 mg total) by mouth 2 (two) times daily. X 5-7 days for flareup   gemfibrozil  (LOPID ) 600 MG tablet Take 1 tablet (600 mg total) by mouth 2 (two) times daily before a meal.   No facility-administered encounter medications on file as of 09/05/2024.    Allergies: Allergies[1]  Family History: Family History  Problem Relation Age of Onset   Hypertension Mother    Hyperlipidemia Mother    Thyroid  disease Mother    Cancer Father 35       prostate   Diabetes Sister    Other Brother        died Sep 26, 2020 in MVA   Other Brother        congenital   Other Maternal Aunt     Social History: Social History[2] Social History   Social History Narrative   Lives with wife and 3 kids.  1 child in college.  Exercise - walks some in the mornings.   working health and safety inspector job.   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   03/2023      Are you right handed or left handed? Right Handed   Are you currently employed ? Yes   What is your current occupation?   Do you live at home alone? No   Who lives with you? Wife and children    What type of home do you live in: 1 story or 2 story? Lives in a two story home        Vital Signs:  BP 129/86   Pulse 90   Ht 5' 2 (1.575 m)   Wt 157 lb (71.2 kg)   SpO2 100%   BMI 28.72 kg/m    Neurological Exam: MENTAL STATUS including orientation to time, place, person, recent and remote memory, attention span and concentration, language, and fund of knowledge is normal.  Speech is not dysarthric.  CRANIAL NERVES: II:  No visual field defects.     III-IV-VI: Pupils equal round and reactive to light.  Normal conjugate, extra-ocular eye movements in all directions of gaze.  No nystagmus.  No ptosis.   V:  Normal facial sensation.    VII:  Normal  facial symmetry and movements.   VIII:  Normal hearing and vestibular function.   IX-X:  Normal palatal movement.   XI:  Normal shoulder shrug and head rotation.   XII:  Normal tongue strength and range of motion, no deviation or fasciculation.  MOTOR:  No atrophy, fasciculations or abnormal movements.  No pronator drift.   Upper Extremity:  Right  Left  Deltoid  5/5   5/5   Biceps  5/5   5/5   Triceps  5/5   5/5   Wrist extensors  5/5   5/5   Wrist flexors  5/5   5/5   Finger extensors  5/5   5/5   Finger flexors  5/5   5/5   Dorsal interossei  5/5   5/5   Abductor pollicis  5/5   5/5   Tone (Ashworth scale)  0  0   Lower Extremity:  Right  Left  Hip flexors  5/5   5/5   Knee flexors  5/5   5/5   Knee extensors  5/5   5/5   Dorsiflexors  5/5   5/5   Plantarflexors  5/5   5/5   Toe extensors  5/5   5/5   Toe flexors  5/5   5/5   Tone (Ashworth scale)  0  0   MSRs:                                           Right        Left brachioradialis 2+  2+  biceps 2+  2+  triceps 2+  2+  patellar 3+  3+   ankle jerk 2+  2+  Hoffman no  no   SENSORY:  Normal and symmetric perception of light touch, pinprick, vibration, and temperature.  Romberg's sign absent.   COORDINATION/GAIT: Normal finger-to- nose-finger.  Intact rapid alternating movements bilaterally.  Gait narrow based and stable. Tandem and stressed gait intact.    Thank you for allowing me to participate in patient's care.  If I can answer any additional questions, I would be pleased to do so.    Sincerely,    Emmilee Reamer K. Laurelin Elson, DO     [1] No Known Allergies [2]  Social History Tobacco Use   Smoking status: Former    Current packs/day: 0.00    Types: Cigarettes    Quit date: 2003    Years since quitting: 23.0   Smokeless tobacco: Never  Vaping Use   Vaping status: Never Used  Substance Use Topics   Alcohol use: Not Currently    Comment: special occasions    Drug use: No   "

## 2024-09-05 NOTE — Patient Instructions (Signed)
 MRI cervical spine  Nerve testing of the left arm and leg  ELECTROMYOGRAM AND NERVE CONDUCTION STUDIES (EMG/NCS) INSTRUCTIONS  How to Prepare The neurologist conducting the EMG will need to know if you have certain medical conditions. Tell the neurologist and other EMG lab personnel if you: Have a pacemaker or any other electrical medical device Take blood-thinning medications Have hemophilia, a blood-clotting disorder that causes prolonged bleeding Bathing Take a shower or bath shortly before your exam in order to remove oils from your skin. Dont apply lotions or creams before the exam.  What to Expect Youll likely be asked to change into a hospital gown for the procedure and lie down on an examination table. The following explanations can help you understand what will happen during the exam.  Electrodes. The neurologist or a technician places surface electrodes at various locations on your skin depending on where youre experiencing symptoms. Or the neurologist may insert needle electrodes at different sites depending on your symptoms.  Sensations. The electrodes will at times transmit a tiny electrical current that you may feel as a twinge or spasm. The needle electrode may cause discomfort or pain that usually ends shortly after the needle is removed. If you are concerned about discomfort or pain, you may want to talk to the neurologist about taking a short break during the exam.  Instructions. During the needle EMG, the neurologist will assess whether there is any spontaneous electrical activity when the muscle is at rest - activity that isnt present in healthy muscle tissue - and the degree of activity when you slightly contract the muscle.  He or she will give you instructions on resting and contracting a muscle at appropriate times. Depending on what muscles and nerves the neurologist is examining, he or she may ask you to change positions during the exam.  After your EMG You may  experience some temporary, minor bruising where the needle electrode was inserted into your muscle. This bruising should fade within several days. If it persists, contact your primary care doctor.

## 2024-10-16 ENCOUNTER — Other Ambulatory Visit

## 2024-11-03 ENCOUNTER — Encounter: Payer: Self-pay | Admitting: Neurology

## 2025-07-05 ENCOUNTER — Encounter: Admitting: Medical
